# Patient Record
Sex: Male | Born: 1970 | Race: White | Hispanic: No | Marital: Single | Smoking: Former smoker
Health system: Southern US, Community
[De-identification: ages and names within clinical notes are randomized; demographics above are authoritative.]

## PROBLEM LIST (undated history)

## (undated) DIAGNOSIS — I519 Heart disease, unspecified: Secondary | ICD-10-CM

## (undated) DIAGNOSIS — Z720 Tobacco use: Secondary | ICD-10-CM

## (undated) DIAGNOSIS — Z8659 Personal history of other mental and behavioral disorders: Secondary | ICD-10-CM

## (undated) DIAGNOSIS — I4892 Unspecified atrial flutter: Secondary | ICD-10-CM

## (undated) DIAGNOSIS — I2510 Atherosclerotic heart disease of native coronary artery without angina pectoris: Secondary | ICD-10-CM

## (undated) DIAGNOSIS — N2889 Other specified disorders of kidney and ureter: Principal | ICD-10-CM

## (undated) DIAGNOSIS — I253 Aneurysm of heart: Secondary | ICD-10-CM

## (undated) DIAGNOSIS — I10 Essential (primary) hypertension: Secondary | ICD-10-CM

## (undated) DIAGNOSIS — I214 Non-ST elevation (NSTEMI) myocardial infarction: Secondary | ICD-10-CM

## (undated) DIAGNOSIS — B20 Human immunodeficiency virus [HIV] disease: Secondary | ICD-10-CM

## (undated) DIAGNOSIS — F319 Bipolar disorder, unspecified: Secondary | ICD-10-CM

## (undated) DIAGNOSIS — Z21 Asymptomatic human immunodeficiency virus [HIV] infection status: Secondary | ICD-10-CM

## (undated) HISTORY — PX: MULTIPLE TOOTH EXTRACTIONS: SHX2053

## (undated) HISTORY — DX: Other specified disorders of kidney and ureter: N28.89

## (undated) HISTORY — DX: Essential (primary) hypertension: I10

## (undated) HISTORY — DX: Personal history of other mental and behavioral disorders: Z86.59

## (undated) HISTORY — DX: Human immunodeficiency virus (HIV) disease: B20

## (undated) HISTORY — DX: Bipolar disorder, unspecified: F31.9

## (undated) HISTORY — DX: Unspecified atrial flutter: I48.92

## (undated) HISTORY — DX: Atherosclerotic heart disease of native coronary artery without angina pectoris: I25.10

## (undated) HISTORY — PX: CORONARY ARTERY BYPASS GRAFT: SHX141

## (undated) HISTORY — PX: HERNIA REPAIR: SHX51

## (undated) HISTORY — DX: Non-ST elevation (NSTEMI) myocardial infarction: I21.4

## (undated) HISTORY — DX: Heart disease, unspecified: I51.9

## (undated) HISTORY — DX: Tobacco use: Z72.0

## (undated) HISTORY — DX: Asymptomatic human immunodeficiency virus (hiv) infection status: Z21

## (undated) HISTORY — DX: Aneurysm of heart: I25.3

---

## 2000-02-26 DIAGNOSIS — Z21 Asymptomatic human immunodeficiency virus [HIV] infection status: Secondary | ICD-10-CM

## 2000-02-26 DIAGNOSIS — B20 Human immunodeficiency virus [HIV] disease: Secondary | ICD-10-CM

## 2000-02-26 HISTORY — DX: Human immunodeficiency virus (HIV) disease: B20

## 2000-02-26 HISTORY — DX: Asymptomatic human immunodeficiency virus (hiv) infection status: Z21

## 2004-10-22 ENCOUNTER — Emergency Department: Payer: Self-pay | Admitting: Emergency Medicine

## 2004-10-26 ENCOUNTER — Emergency Department: Payer: Self-pay | Admitting: Emergency Medicine

## 2004-10-27 ENCOUNTER — Emergency Department: Payer: Self-pay | Admitting: Emergency Medicine

## 2008-03-23 ENCOUNTER — Other Ambulatory Visit: Payer: Self-pay

## 2008-03-23 ENCOUNTER — Emergency Department: Payer: Self-pay | Admitting: Emergency Medicine

## 2008-05-03 ENCOUNTER — Emergency Department: Payer: Self-pay | Admitting: Emergency Medicine

## 2008-07-11 ENCOUNTER — Emergency Department: Payer: Self-pay | Admitting: Emergency Medicine

## 2009-04-20 ENCOUNTER — Emergency Department: Payer: Self-pay

## 2009-04-22 DIAGNOSIS — E785 Hyperlipidemia, unspecified: Secondary | ICD-10-CM

## 2009-04-25 ENCOUNTER — Emergency Department: Payer: Self-pay | Admitting: Emergency Medicine

## 2009-04-26 ENCOUNTER — Emergency Department: Payer: Self-pay | Admitting: Emergency Medicine

## 2009-05-31 DIAGNOSIS — L409 Psoriasis, unspecified: Secondary | ICD-10-CM | POA: Insufficient documentation

## 2009-09-11 DIAGNOSIS — I252 Old myocardial infarction: Secondary | ICD-10-CM | POA: Insufficient documentation

## 2009-09-14 ENCOUNTER — Inpatient Hospital Stay: Payer: Self-pay | Admitting: Cardiology

## 2009-09-14 ENCOUNTER — Ambulatory Visit: Payer: Self-pay | Admitting: Cardiology

## 2009-09-15 ENCOUNTER — Inpatient Hospital Stay (HOSPITAL_COMMUNITY): Admission: EM | Admit: 2009-09-15 | Discharge: 2009-09-25 | Payer: Self-pay | Admitting: Internal Medicine

## 2009-09-15 ENCOUNTER — Ambulatory Visit: Payer: Self-pay | Admitting: Cardiology

## 2009-09-16 ENCOUNTER — Encounter: Payer: Self-pay | Admitting: Cardiology

## 2009-09-16 ENCOUNTER — Ambulatory Visit: Payer: Self-pay | Admitting: Cardiothoracic Surgery

## 2009-09-18 DIAGNOSIS — I259 Chronic ischemic heart disease, unspecified: Secondary | ICD-10-CM | POA: Insufficient documentation

## 2009-09-27 ENCOUNTER — Emergency Department: Payer: Self-pay | Admitting: Emergency Medicine

## 2009-09-28 ENCOUNTER — Ambulatory Visit: Payer: Self-pay | Admitting: Cardiovascular Disease

## 2009-10-14 ENCOUNTER — Ambulatory Visit: Payer: Self-pay | Admitting: Cardiothoracic Surgery

## 2009-10-14 ENCOUNTER — Encounter: Admission: RE | Admit: 2009-10-14 | Discharge: 2009-10-14 | Payer: Self-pay | Admitting: Cardiothoracic Surgery

## 2009-10-20 ENCOUNTER — Ambulatory Visit: Payer: Self-pay | Admitting: Cardiovascular Disease

## 2009-10-20 DIAGNOSIS — I10 Essential (primary) hypertension: Secondary | ICD-10-CM

## 2009-10-20 DIAGNOSIS — I25719 Atherosclerosis of autologous vein coronary artery bypass graft(s) with unspecified angina pectoris: Secondary | ICD-10-CM

## 2009-10-28 ENCOUNTER — Encounter: Payer: Self-pay | Admitting: Cardiovascular Disease

## 2009-11-04 ENCOUNTER — Encounter: Payer: Self-pay | Admitting: Cardiovascular Disease

## 2009-11-09 ENCOUNTER — Encounter: Payer: Self-pay | Admitting: Cardiovascular Disease

## 2009-11-09 ENCOUNTER — Ambulatory Visit: Payer: Self-pay

## 2009-11-15 ENCOUNTER — Telehealth: Payer: Self-pay | Admitting: Cardiovascular Disease

## 2009-11-17 ENCOUNTER — Ambulatory Visit: Payer: Self-pay | Admitting: Cardiovascular Disease

## 2009-11-18 LAB — CONVERTED CEMR LAB
Alkaline Phosphatase: 75 units/L (ref 39–117)
CO2: 21 meq/L (ref 19–32)
Cholesterol: 92 mg/dL (ref 0–200)
Creatinine, Ser: 0.97 mg/dL (ref 0.40–1.50)
Glucose, Bld: 81 mg/dL (ref 70–99)
HDL: 31 mg/dL — ABNORMAL LOW (ref >39)
LDL Cholesterol: 44 mg/dL (ref 0–99)
Potassium: 4 meq/L (ref 3.5–5.3)
Total Bilirubin: 0.4 mg/dL (ref 0.3–1.2)
Total CHOL/HDL Ratio: 3
Total Protein: 8 g/dL (ref 6.0–8.3)
Triglycerides: 84 mg/dL (ref <150)
VLDL: 17 mg/dL (ref 0–40)

## 2009-11-19 ENCOUNTER — Encounter: Payer: Self-pay | Admitting: Internal Medicine

## 2009-11-26 ENCOUNTER — Encounter: Payer: Self-pay | Admitting: Cardiovascular Disease

## 2009-12-06 ENCOUNTER — Telehealth: Payer: Self-pay | Admitting: Cardiology

## 2009-12-24 ENCOUNTER — Telehealth: Payer: Self-pay | Admitting: Cardiology

## 2009-12-26 ENCOUNTER — Encounter: Payer: Self-pay | Admitting: Cardiovascular Disease

## 2009-12-29 ENCOUNTER — Encounter: Payer: Self-pay | Admitting: Cardiology

## 2010-01-05 ENCOUNTER — Telehealth: Payer: Self-pay | Admitting: Cardiovascular Disease

## 2010-01-19 ENCOUNTER — Encounter: Payer: Self-pay | Admitting: Cardiovascular Disease

## 2010-01-26 ENCOUNTER — Encounter: Payer: Self-pay | Admitting: Cardiovascular Disease

## 2010-01-31 ENCOUNTER — Encounter: Payer: Self-pay | Admitting: Internal Medicine

## 2010-02-04 ENCOUNTER — Telehealth: Payer: Self-pay | Admitting: Internal Medicine

## 2010-02-04 ENCOUNTER — Encounter: Payer: Self-pay | Admitting: Cardiovascular Disease

## 2010-04-15 ENCOUNTER — Encounter: Payer: Self-pay | Admitting: Cardiovascular Disease

## 2010-04-19 ENCOUNTER — Ambulatory Visit: Payer: Self-pay | Admitting: Cardiovascular Disease

## 2010-04-19 ENCOUNTER — Ambulatory Visit: Payer: Self-pay

## 2010-04-19 DIAGNOSIS — B20 Human immunodeficiency virus [HIV] disease: Secondary | ICD-10-CM

## 2010-04-19 DIAGNOSIS — I739 Peripheral vascular disease, unspecified: Secondary | ICD-10-CM | POA: Insufficient documentation

## 2010-05-07 ENCOUNTER — Encounter: Payer: Self-pay | Admitting: Cardiovascular Disease

## 2010-05-23 ENCOUNTER — Telehealth: Payer: Self-pay | Admitting: Cardiovascular Disease

## 2010-06-18 ENCOUNTER — Encounter: Payer: Self-pay | Admitting: Cardiovascular Disease

## 2010-06-18 ENCOUNTER — Ambulatory Visit: Payer: Self-pay | Admitting: Cardiovascular Disease

## 2010-06-18 ENCOUNTER — Observation Stay: Payer: Self-pay | Admitting: Internal Medicine

## 2010-06-20 ENCOUNTER — Telehealth: Payer: Self-pay | Admitting: Cardiovascular Disease

## 2010-06-27 ENCOUNTER — Ambulatory Visit: Payer: Self-pay | Admitting: Cardiovascular Disease

## 2010-06-27 ENCOUNTER — Observation Stay: Payer: Self-pay | Admitting: Internal Medicine

## 2010-06-28 ENCOUNTER — Encounter: Payer: Self-pay | Admitting: Cardiovascular Disease

## 2010-08-01 ENCOUNTER — Telehealth: Payer: Self-pay | Admitting: Cardiovascular Disease

## 2010-08-11 ENCOUNTER — Encounter: Payer: Self-pay | Admitting: Cardiovascular Disease

## 2010-08-11 ENCOUNTER — Ambulatory Visit: Payer: Self-pay | Admitting: Cardiovascular Disease

## 2010-08-11 DIAGNOSIS — E785 Hyperlipidemia, unspecified: Secondary | ICD-10-CM

## 2010-09-27 NOTE — Progress Notes (Signed)
Summary: Monitor  Phone Note Call from Patient Call back at [phone]   Caller: Self Call For: [facility] Summary of Call: Pt just went off of monitor and would like to know if he needs to come in for an appt. Initial call taken by: Harlon Flor,  August 01, 2010 1:21 PM  Follow-up for Phone Call        Spoke to pt, follow up scheduled with Dr. Mariah Milling next week 08/11/10 Follow-up by: Lanny Hurst RN,  August 01, 2010 3:11 PM

## 2010-09-27 NOTE — Assessment & Plan Note (Signed)
Summary: NURSE/AMD  Nurse Visit   Vital Signs:  Patient profile:   40 year old male Weight:      216.50 pounds Pulse rate:   80 / minute Pulse rhythm:   regular BP sitting:   102 / 64  (left arm) Cuff size:   regular  Vitals Entered By: Charlena Cross, RN, BSN (September 28, 2009 3:17 PM)  Patient Instructions: 1)  Your physician recommends that you schedule a follow-up appointment in: 2/23 @ 10:30

## 2010-09-27 NOTE — Progress Notes (Signed)
Summary: clearance for dental extractions  Phone Note Outgoing Call   Call placed by: Cloyde Reams RN,  Jan 05, 2010 3:08 PM Call placed to: Patient Summary of Call: Called pt to give information on Dr Alford Highland recommendations in previous appended phone note, and pt wanted to know if it has been long enough since his OHS that he can proceed with some dental work needed, and tooth extractions.   Initial call taken by: Cloyde Reams RN,  Jan 05, 2010 3:12 PM  Follow-up for Phone Call        Should be fine to proceed with dental procedure     Appended Document: clearance for dental extractions Called spoke with pt advised OK per Dr Mariah Milling to proceed with dental work.  EWJ

## 2010-09-27 NOTE — Assessment & Plan Note (Signed)
Summary: F6M/AMD   Visit Type:  Follow-up Referring Provider:  Shirlee Latch Primary Provider:  N/A  CC:  c/o chest pain for 6 months..  History of Present Illness: Jared Williams a 40 year old gentleman with coronary artery disease, non-ST elevation MI in January 2011, transferred from [facility] to [facility] for two-vessel bypass surgery. He had a LIMA to the LAD, vein graft to the PDA. Also with apical aneurysm resection. History of postop atrial flutter, on amiodarone. History of HIV, bipolar disorder, schizophrenia, long history of tobacco abuse stopped in January 2011.  He presents for routine followup post CABG. He reports that he has been tired. He does not do any exercise during the daytime. He wonders if his fatigue is due to the study medicine for his HIV that he is taking. He is not eating well as he has low income and has not so healthy foods. He denies any tachycardia palpitations, does have some atypical type chest discomfort which he attributes to postsurgical healing  EKG shows normal sinus rhythm with rate of 56 beats per minute, T-wave abnormality in V3, V4  Current Medications (verified): 1)  Aspirin Ec 325 Mg Tbec (Aspirin) .... Take One Tablet By Mouth Daily 2)  Carvedilol 25 Mg Tabs (Carvedilol) .... Take One Tablet By Mouth Twice A Day 3)  Lisinopril 2.5 Mg Tabs (Lisinopril) .... Take One Tablet By Mouth Daily 4)  Crestor 40 Mg Tabs (Rosuvastatin Calcium) .... Take One Tablet By Mouth Daily. 5)  Spironolactone 25 Mg Tabs (Spironolactone) .... 1/2  Tablet By Mouth Daily 6)  Klonopin 1 Mg Tabs (Clonazepam) .Marland Kitchen.. 1 By Mouth Three Times A Day As Needed For Anxiety 7)  Study Drug For Hiv  Allergies (verified): 1)  ! Pcn 2)  ! Ampicillin  Past History:  Past Medical History: Last updated: 10/20/2009  1. Acute non-ST segment elevation myocardial infarction.   2. Left ventricular dysfunction.   3. Coronary artery disease.   4. Left ventricular apical  aneurysm.   5. Postoperative atrial flutter.   6. History of human immunodeficiency virus, currently under treatment       by Dr. Zenaida Niece Der Hildred Laser at [facility] on no antiretroviral therapy.   7. Hypertension.   8. Bipolar disorder.   9. History of schizophrenia.   10.Long history of tobacco abuse.   11.Staged recent dental extractions.   Past Surgical History: Last updated: 10/20/2009 CABG  Dental extractions partially completed Hernia repair on the left.   Family History: Last updated: 10/20/2009 Unknown.  The patient was adopted  Social History: Last updated: 10/20/2009 The patient lives alone, has 1 daughter.  Smoked 1 pack   a day for at least 23 years.  Denies alcohol use.  Current disability   for psychiatric illness.  Lives in [place].   Risk Factors: Alcohol Use: 0 (10/20/2009) Caffeine Use: no (10/20/2009) Exercise: yes (10/20/2009)  Risk Factors: Smoking Status: quit (10/20/2009)  Review of Systems  The patient denies fever, weight loss, weight gain, vision loss, decreased hearing, hoarseness, chest pain, syncope, dyspnea on exertion, peripheral edema, prolonged cough, abdominal pain, incontinence, muscle weakness, depression, and enlarged lymph nodes.         Fatigue, atypical chest pains  Vital Signs:  Patient profile:   40 year old male Height:      72 inches Weight:      220 pounds BMI:     29.95 Pulse rate:   59 / minute BP sitting:   118 / 80  (  left arm) Cuff size:   regular  Vitals Entered By: Bishop Dublin, CMA (April 19, 2010 9:51 AM)  Physical Exam  General:  well-appearing gentleman in no apparent distress, alert and oriented x3 , HEENT exam is benign apart from terrible dentition, oropharynx is clear, neck is supple with no JVP or carotid bruits, heart sounds are regular with normal S1-S2 and no murmurs appreciated, lungs are clear to auscultation with no wheezes or rales, abdominal exam is benign, no significant lower extremity edema, pulses are  equal and symmetrical in his upper extremities, decreased in LE, neurologic exam is grossly nonfocal, skin is warm and dry.mediastinal incision site is well-healed.   Impression & Recommendations:  Problem # 1:  CORONARY ATHEROSCLEROSIS OF ARTERY BYPASS GRAFT (ICD-414.04) we'll continue aggressive medical management. Chest pain is atypical in nature and likely musculoskeletal. I've asked him to monitor his heart rate at home. He is 56 in the office today. If he continues to have significant bradycardia with rates in the low 50s, we could consider decreasing his Coreg to 12.5 b.i.d.  His updated medication list for this problem includes:    Aspirin Ec 325 Mg Tbec (Aspirin) .Marland Kitchen... Take one tablet by mouth daily    Carvedilol 25 Mg Tabs (Carvedilol) .Marland Kitchen... Take one tablet by mouth twice a day    Lisinopril 2.5 Mg Tabs (Lisinopril) .Marland Kitchen... Take one tablet by mouth daily  Problem # 2:  INTERMITTENT CLAUDICATION, BILATERAL (ICD-443.9) He has decreased pulses in his lower extremities. We did an ABI exam today and this was essentially normal. No further workup indicated.  Problem # 3:  ESSENTIAL HYPERTENSION (ICD-401.9) blood pressure is adequate and we had not changed his medication regimen.  His updated medication list for this problem includes:    Aspirin Ec 325 Mg Tbec (Aspirin) .Marland Kitchen... Take one tablet by mouth daily    Carvedilol 25 Mg Tabs (Carvedilol) .Marland Kitchen... Take one tablet by mouth twice a day    Lisinopril 2.5 Mg Tabs (Lisinopril) .Marland Kitchen... Take one tablet by mouth daily    Spironolactone 25 Mg Tabs (Spironolactone) .Marland Kitchen... 1/2  tablet by mouth daily  Problem # 4:  HIV INFECTION (ICD-042) He has had infection for 10 years. Currently on a study medicine for his HIV.  Patient Instructions: 1)  Your physician recommends that you continue on your current medications as directed. Please refer to the Current Medication list given to you today. 2)  Your physician wants you to follow-up in:   6 months  You will receive a reminder letter in the mail two months in advance. If you don't receive a letter, please call our office to schedule the follow-up appointment. 3)  Your physician has requested that you have a lower extremity arterial duplex.  This test is an ultrasound of the arteries in the legs.  It looks at arterial blood flow in the legs.  Allow one hour for Lower.  4)  There are no restrictions or special instructions. 5)  Your physician has requested that you regularly monitor and record your blood pressure readings at home.

## 2010-09-27 NOTE — Miscellaneous (Signed)
Summary: Heart Track Discharge  Heart Track Discharge   Imported By: Harlon Flor 02/16/2010 08:36:07  _____________________________________________________________________  External Attachment:    Type:   Image     Comment:   External Document

## 2010-09-27 NOTE — Progress Notes (Signed)
Summary: Question  Phone Note Call from Patient Call back at Home Phone [phone]   Caller: Patient Call For: RN Summary of Call: Patient called wanting to speak with RN, said he had a personal medical question.  Would like a call back. Initial call taken by: [place],  December 06, 2009 8:29 AM  Follow-up for Phone Call        Left message to call. 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 states that now that he has had CABG, his ID MD would like to enroll him in a study investigating pts with heart disease who are HIV +.  Pt would like MD's opinion on antiretroviral medications and their effects on the heart before starting study. Additional Follow-up by: Charlena Cross, RN, BSN,  December 06, 2009 11:36 AM     Appended Document: Question some antiretrovirals cause can adversely affect lipids which can increase heart disease risk.  However, that can be controlled with statins. Would be interested to know exactly what is being studied in this trial.   Appended Document: Question pt aware and will obtain prospectus for study intent. Charlena Cross RN BSN   Appended Document: Question pt called with more information regarding potential study that his PCP is encouraging he participate in.  Study participants will be assigned to either group rec'ing ATRIPLA (current HIV medication) or a new experimental medication being termed BTRIPLA (Btripla is intended to be a safer follow-up to Atripla, a single-pill combination of Truvada plus [employer]s Sustiva (efavirenz) ).  Pts PCP feels that his recent MI could have been the result of untreated HIV.  Pt would like Dr. Kathlyn Sacramento opinion. Charlena Cross RN BSN   Appended Document: Question HIV promotes accelerated atherosclerosis.  Inflammatory state can increase risk of plaque  rupture.   Appended Document: Question Spoke with pt advised of Dr Alford Highland response.   EWJ

## 2010-09-27 NOTE — Letter (Signed)
Summary: [facility]   Imported By: Harlon Flor 05/16/2010 14:59:57  _____________________________________________________________________  External Attachment:    Type:   Image     Comment:   External Document

## 2010-09-27 NOTE — Consult Note (Signed)
SummaryScientist, physiological [facility]   [facility]   Imported By: Roderic Ovens 06/24/2010 16:10:51  _____________________________________________________________________  External Attachment:    Type:   Image     Comment:   External Document

## 2010-09-27 NOTE — Progress Notes (Signed)
Summary: HEART TRACK  Phone Note Call from Patient Call back at Home Phone [phone]   Caller: SELF Call For: [place] Summary of Call: PT WANTED TO LET THE PHYSICIAN KNOW THAT HE HAS COMPLETED HEART TRACK AND DID NOT KNOW IF HE NEEDED TO SCHEDULE A F/U APPT BECAUSE OF THAT Initial call taken by: Harlon Flor,  February 04, 2010 1:57 PM  Follow-up for Phone Call        Called spoke with pt advised as long as he is not having problems last OV note from 2/11 states pt needs f/u ov in 6 months. Pt states he is doing well and WCB for sooner appt if needed. Follow-up by: Cloyde Reams RN,  February 04, 2010 3:31 PM

## 2010-09-27 NOTE — Progress Notes (Signed)
Summary: RX  Phone Note Refill Request Call back at Home Phone [phone] Message from:  Patient on June 20, 2010 2:39 PM  Refills Requested: Medication #1:  LISINOPRIL 2.5 MG TABS Take one tablet by mouth daily  Medication #2:  CARVEDILOL 25 MG TABS Take one tablet by mouth twice a day  Medication #3:  SPIRONOLACTONE 25 MG TABS 1/2  tablet by mouth daily  Medication #4:  CRESTOR 40 MG TABS Take one tablet by mouth daily. [facility] [address]  Initial call taken by: Harlon Flor,  June 20, 2010 2:39 PM    Prescriptions: SPIRONOLACTONE 25 MG TABS (SPIRONOLACTONE) 1/2  tablet by mouth daily  #30 x 3   Entered by:   Bishop Dublin, CMA   Authorized by:   Dossie Arbour MD   Signed by:   Bishop Dublin, CMA on 06/20/2010   Method used:   Electronically to        [facility] [address] (retail)       [address]       [place], [place]  [address]       Ph: [phone]       Fax: [phone]   RxID:   [id number] CRESTOR 40 MG TABS (ROSUVASTATIN CALCIUM) Take one tablet by mouth daily.  #30 x 6   Entered by:   Bishop Dublin, CMA   Authorized by:   Dossie Arbour MD   Signed by:   Bishop Dublin, CMA on 06/20/2010   Method used:   Electronically to        [facility] [address] (retail)       [address]       [place], [place]  [address]       Ph: [phone]       Fax: [phone]   RxID:   [id number] LISINOPRIL 2.5 MG TABS (LISINOPRIL) Take one tablet by mouth daily  #30 x 6   Entered by:   Bishop Dublin, CMA   Authorized by:   Dossie Arbour MD   Signed by:   Bishop Dublin, CMA on 06/20/2010   Method used:   Electronically to        [facility] [address] (retail)       [address]       [place], [place]  [address]       Ph: [phone]       Fax: [phone]   RxID:   [id number] CARVEDILOL 25 MG TABS (CARVEDILOL)  Take one tablet by mouth twice a day  #60 x 6   Entered by:   Bishop Dublin, CMA   Authorized by:   Dossie Arbour MD   Signed by:   Bishop Dublin, CMA on 06/20/2010   Method used:   Electronically to        [facility] [address] (retail)       [address]       [place], [place]  [address]       Ph: [phone]       Fax: [phone]   RxID:   [id number]

## 2010-09-27 NOTE — Miscellaneous (Signed)
Summary: Heart Track  Heart Track   Imported By: Harlon Flor 10/29/2009 14:36:29  _____________________________________________________________________  External Attachment:    Type:   Image     Comment:   External Document

## 2010-09-27 NOTE — Progress Notes (Signed)
Summary: RX  Phone Note Refill Request Call back at Home Phone [phone] Message from:  Patient on November 15, 2009 12:08 PM  Refills Requested: Medication #1:  CARVEDILOL 25 MG TABS Take one tablet by mouth twice a day  Medication #2:  LISINOPRIL 2.5 MG TABS Take one tablet by mouth daily  Medication #3:  CRESTOR 40 MG TABS Take one tablet by mouth daily.  Medication #4:  SPIRONOLACTONE 25 MG TABS 1/2  tablet by mouth daily NEEDS [facility] [address]  Initial call taken by: Harlon Flor,  November 15, 2009 12:10 PM    Prescriptions: SPIRONOLACTONE 25 MG TABS (SPIRONOLACTONE) 1/2  tablet by mouth daily  #30 x 3   Entered by:   Mercer Pod   Authorized by:   Dossie Arbour MD   Signed by:   Mercer Pod on 11/15/2009   Method used:   Electronically to        [facility] [address] (retail)       [address]       [place], [place]  [address]       Ph: [phone]       Fax: [phone]   RxID:   [id number] CRESTOR 40 MG TABS (ROSUVASTATIN CALCIUM) Take one tablet by mouth daily.  #30 x 6   Entered by:   Mercer Pod   Authorized by:   Dossie Arbour MD   Signed by:   Mercer Pod on 11/15/2009   Method used:   Electronically to        [facility] [address] (retail)       [address]       [place], [place]  [address]       Ph: [phone]       Fax: [phone]   RxID:   [id number] LISINOPRIL 2.5 MG TABS (LISINOPRIL) Take one tablet by mouth daily  #30 x 6   Entered by:   Mercer Pod   Authorized by:   Dossie Arbour MD   Signed by:   Mercer Pod on 11/15/2009   Method used:   Electronically to        [facility] [address] (retail)       [address]       [place], [place]  [address]       Ph: [phone]       Fax: [phone]   RxID:   [id number] CARVEDILOL 25 MG TABS  (CARVEDILOL) Take one tablet by mouth twice a day  #60 x 6   Entered by:   Mercer Pod   Authorized by:   Dossie Arbour MD   Signed by:   Mercer Pod on 11/15/2009   Method used:   Electronically to        [facility] [address] (retail)       [address]       [place], [place]  [address]       Ph: [phone]       Fax: [phone]   RxID:   [id number]

## 2010-09-27 NOTE — Miscellaneous (Signed)
Summary: [facility] Written Confirmation of Verbal Order  [facility] Written Confirmation of Verbal Order   Imported By: Roderic Ovens 01/25/2010 15:19:24  _____________________________________________________________________  External Attachment:    Type:   Image     Comment:   External Document

## 2010-09-27 NOTE — Progress Notes (Signed)
Summary: EXTRACTION  Phone Note Call from Patient Call back at [phone]   Caller: SELF Call For: [facility] Summary of Call: PT HAD TEETH EXTRACTED TODAY AND WAS TOLD TO STOP ASPIRIN FOR SEVERAL DAYS-WOULD LIKE TO KNOW IF THIS IS OK. Initial call taken by: Harlon Flor,  May 23, 2010 2:52 PM  Follow-up for Phone Call        [facility]CB Benedict Needy, RN  May 24, 2010 2:08 PM   Additional Follow-up for Phone Call Additional follow up Details #1::        notified patient of tooth extraction, patient states had done yesterday 05/23/2010.  He is calling after the fact to tell us he stopped aspirin per the dentist.  He will be off for one to two more days and then start back.   Additional Follow-up by: Bishop Dublin, CMA,  May 24, 2010 2:43 PM

## 2010-09-27 NOTE — Letter (Signed)
SummaryScientist, physiological [facility]   [facility]   Imported By: Roderic Ovens 07/05/2010 15:54:45  _____________________________________________________________________  External Attachment:    Type:   Image     Comment:   External Document

## 2010-09-27 NOTE — Miscellaneous (Signed)
Summary: [facility]  [facility]   Imported By: Harlon Flor 04/19/2010 15:02:10  _____________________________________________________________________  External Attachment:    Type:   Image     Comment:   External Document

## 2010-09-27 NOTE — Miscellaneous (Signed)
Summary: [facility]  [facility]   Imported By: Harlon Flor 01/21/2010 11:29:38  _____________________________________________________________________  External Attachment:    Type:   Image     Comment:   External Document

## 2010-09-27 NOTE — Assessment & Plan Note (Signed)
Summary: f3w   Visit Type:  EC6 Referring Provider:  Shirlee Latch Primary Provider:  N/A  CC:  just incision aches and pains, don't take much pain meds, and due to constipation. no sob. no edema..  History of Present Illness: Jared Williams a 39 year old gentleman with coronary artery disease, non-ST elevation MI in January 2011, transferred from [facility] to [facility] for two-vessel bypass surgery. He had a LIMA to the LAD, vein graft to the PDA. Also with apical aneurysm resection. History of postop atrial flutter, on amiodarone. History of HIV, bipolar disorder, schizophrenia, long history of tobacco abuse stopped in January 2011.  He presents for routine followup post CABG. He states that he feels well, is emanating for 15 minutes a day though continues to have some shortness of breath. He is interested in cardiac rehabilitation. He states that his chest is healing well. His amiodarone was decreased to 200 mg daily last week and he has not noted any tachypalpitations.  He is not noticed any edema. He does have occasional lightheadedness and he has to stand for a minute before this resolves. No recent lipid panel since he had been on Crestor for the last month.  Preventive Screening-Counseling & Management  Alcohol-Tobacco     Alcohol drinks/day: 0     Smoking Status: quit  Caffeine-Diet-Exercise     Caffeine use/day: no     Does Patient Exercise: yes     Type of exercise: walk      Exercise (avg: min/session): 15 mins     Times/week: 7 days a week  Current Problems (verified): 1)  Essential Hypertension  (ICD-401.9) 2)  Coronary Atherosclerosis of Artery Bypass Graft  (ICD-414.04)  Current Medications (verified): 1)  Amiodarone Hcl 200 Mg Tabs (Amiodarone Hcl) .... Take One Tablet By Mouth Daily 2)  Aspirin Ec 325 Mg Tbec (Aspirin) .... Take One Tablet By Mouth Daily 3)  Carvedilol 25 Mg Tabs (Carvedilol) .... Take One Tablet By Mouth Twice A Day 4)   Lisinopril 2.5 Mg Tabs (Lisinopril) .... Take One Tablet By Mouth Daily 5)  Nicoderm Cq 14 Mg/24hr Pt24 (Nicotine) .... As Directed 6)  Lortab 7.5 7.5-500 Mg Tabs (Hydrocodone-Acetaminophen) .Marland Kitchen.. 1 By Mouth Once Daily As Needed Every 6 Hours 7)  Crestor 40 Mg Tabs (Rosuvastatin Calcium) .... Take One Tablet By Mouth Daily. 8)  Aggrenox 25-200 Mg Xr12h-Cap (Aspirin-Dipyridamole) .... Take One Capsule By Mouth Twice A Day 9)  Spironolactone 25 Mg Tabs (Spironolactone) .... 1/2  Tablet By Mouth Daily 10)  Klonopin 1 Mg Tabs (Clonazepam) .Marland Kitchen.. 1 By Mouth Three Times A Day As Needed For Anxiety  Allergies (verified): 1)  ! Pcn 2)  ! Ampicillin  Past History:  Past Medical History:  1. Acute non-ST segment elevation myocardial infarction.   2. Left ventricular dysfunction.   3. Coronary artery disease.   4. Left ventricular apical aneurysm.   5. Postoperative atrial flutter.   6. History of human immunodeficiency virus, currently under treatment       by Dr. Zenaida Niece Der Hildred Laser at [facility] on no antiretroviral therapy.   7. Hypertension.   8. Bipolar disorder.   9. History of schizophrenia.   10.Long history of tobacco abuse.   11.Staged recent dental extractions.   Past Surgical History: CABG  Dental extractions partially completed Hernia repair on the left.   Family History: Unknown.  The patient was adopted  Social History: The patient lives alone, has 1 daughter.  Smoked 1 pack  a day for at least 23 years.  Denies alcohol use.  Current disability   for psychiatric illness.  Lives in [place]. Alcohol drinks/day:  0 Smoking Status:  quit Caffeine use/day:  no Does Patient Exercise:  yes  Review of Systems       The patient complains of dyspnea on exertion.  The patient denies anorexia, fever, weight loss, weight gain, vision loss, decreased hearing, hoarseness, chest pain, syncope, peripheral edema, prolonged cough, headaches, hemoptysis, abdominal pain, melena, hematochezia,  severe indigestion/heartburn, hematuria, incontinence, genital sores, muscle weakness, suspicious skin lesions, transient blindness, difficulty walking, depression, unusual weight change, abnormal bleeding, enlarged lymph nodes, angioedema, breast masses, and testicular masses.    Physical Exam  General:  well-appearinggentleman in no apparent distress, alert and oriented x3 , HEENT exam is benign apart from terrible dentition, oropharynx is clear, neck is supple with no JVP or carotid bruits, heart sounds are regular with normal S1-S2 and no murmurs appreciated, lungs are clear to auscultation with no wheezes or rales, abdominal exam is benign, no significant lower extremity edema, pulses are equal and symmetrical in his upper and lower extremities, neurologic exam is grossly nonfocal, skin is warm and dry.mediastinal incision site is well-healed.   EKG  Procedure date:  10/20/2009  Findings:      normal sinus rhythm with a rate of 64 beats per minute, possible anterior infarct with poor R-wave progression through the anterior precordial leads. T Wave inversions noted in V1 through V6.  Impression & Recommendations:  Problem # 1:  CORONARY ATHEROSCLEROSIS OF ARTERY BYPASS GRAFT (ICD-414.04) [name] appears to be doing well following his bypass surgery last month. We will hold his amiodarone given that  he is maintaining normal sinus rhythm. have asked him to contact me if he has any tachycardia palpitations. We will check his cholesterol in 6 weeks' time as well as repeat an echocardiogram to get a baseline LV function post bypass.  His updated medication list for this problem includes:    Aspirin Ec 325 Mg Tbec (Aspirin) .Marland Kitchen... Take one tablet by mouth daily    Carvedilol 25 Mg Tabs (Carvedilol) .Marland Kitchen... Take one tablet by mouth twice a day    Lisinopril 2.5 Mg Tabs (Lisinopril) .Marland Kitchen... Take one tablet by mouth daily    Aggrenox 25-200 Mg Xr12h-cap (Aspirin-dipyridamole) .Marland Kitchen... Take one capsule  by mouth twice a day  Orders: Echocardiogram (Echo)  Problem # 2:  ESSENTIAL HYPERTENSION (ICD-401.9)  borderline hypotension on his current medication regimen. I've asked him to contact us if his dizziness gets worse in which case we would likely decrease the dose of his Coreg to 12.5 mg b.i.d. We've made no changes at this time. His updated medication list for this problem includes:    Aspirin Ec 325 Mg Tbec (Aspirin) .Marland Kitchen... Take one tablet by mouth daily    Carvedilol 25 Mg Tabs (Carvedilol) .Marland Kitchen... Take one tablet by mouth twice a day    Lisinopril 2.5 Mg Tabs (Lisinopril) .Marland Kitchen... Take one tablet by mouth daily    Spironolactone 25 Mg Tabs (Spironolactone) .Marland Kitchen... 1/2  tablet by mouth daily  Patient Instructions: 1)  Your physician recommends that you schedule a follow-up appointment in: 6 months 2)  Your physician recommends that you return for a FASTING lipid profile: 6 weeks (lipids) 3)  Your physician has requested that you have an echocardiogram.  Echocardiography is a painless test that uses sound waves to create images of your heart. It provides your doctor with information about  the size and shape of your heart and how well your heart's chambers and valves are working.  This procedure takes approximately one hour. There are no restrictions for this procedure. 4)  Your physician recommends referral and attendance at a Cardiac Rehab Program. [facility] will call with appt.  5)  Your physician has recommended you make the following change in your medication: stop amiodarone  Appended Document: f3w    Clinical Lists Changes  Medications: Removed medication of AGGRENOX 25-200 MG XR12H-CAP (ASPIRIN-DIPYRIDAMOLE) Take one capsule by mouth twice a day

## 2010-09-27 NOTE — Miscellaneous (Signed)
Summary: Orders Update  Clinical Lists Changes  Problems: Added new problem of INTERMITTENT CLAUDICATION, BILATERAL (ICD-443.9) Orders: Added new Test order of Arterial Duplex Lower Extremity (Arterial Duplex Low) - Signed 

## 2010-09-27 NOTE — Miscellaneous (Signed)
Summary: [facility]  [facility]   Imported By: Harlon Flor 01/10/2010 08:09:17  _____________________________________________________________________  External Attachment:    Type:   Image     Comment:   External Document

## 2010-09-27 NOTE — Progress Notes (Signed)
Summary: CALL BACK  Phone Note Call from Patient Call back at Home Phone [phone]   Caller: SELF Call For: [facility] Summary of Call: WOULD LIKE A CALL FROM MELISSA Initial call taken by: Harlon Flor,  December 24, 2009 10:13 AM  Follow-up for Phone Call        calling with study medication information. Follow-up by: Charlena Cross, RN, BSN,  December 24, 2009 10:44 AM

## 2010-09-27 NOTE — Miscellaneous (Signed)
Summary: Home Care Report  Home Care Report   Imported By: [place] Carbo 01/31/2010 09:10:12  _____________________________________________________________________  External Attachment:    Type:   Image     Comment:   External Document

## 2010-09-29 NOTE — Assessment & Plan Note (Signed)
Summary: F/U event monitor post hospital/MES   Visit Type:  Follow-up Referring Provider:  Shirlee Latch Primary Provider:  None  CC:  f/u [facility]. c/o chest pains and shoulder pain yesterday. Occasional SOB and nausea.  .  History of Present Illness: Mr. Jared Williams a 40 year old gentleman with coronary artery disease, non-ST elevation MI in January 2011, transferred from [facility] to [facility] for two-vessel bypass surgery. He had a LIMA to the LAD, vein graft to the PDA. Also with apical aneurysm resection. History of postop atrial flutter, on amiodarone. History of HIV, bipolar disorder, schizophrenia, long history of tobacco abuse stopped in January 2011 (total of 27yrs of smoking).  He was admitted in late October with diarrhea, chest pain. His symptoms were felt to be noncardiac.   He also had an episode of syncope. He was found to be hypotensive. Carotid ultrasound showed no stenosis. No arrhythmia on telemetry. His Lisinopril and Aldactone were held and his coreg was cut in half to 12.5 mg b.i.d..   His event monitor did not show any significant arrhythmia. There was too short periods of sinus tachycardia.  since the medication changes, he has felt well. He was having dizziness with standing and now he has no symptoms. No further near syncope or syncope. He has good energy with no chest pain.  EKG shows normal sinus rhythm with rate of 56 beats per minute, T-wave abnormality in V3, V4  Current Medications (verified): 1)  Aspir-Low 81 Mg Tbec (Aspirin) .Marland Kitchen.. 1 Tablet Daily 2)  Carvedilol 25 Mg Tabs (Carvedilol) .... Take One-Half  Tablet By Mouth Twice A Day 3)  Crestor 40 Mg Tabs (Rosuvastatin Calcium) .... Take One Tablet By Mouth Daily. 4)  Study Drug For Hiv 5)  B Complex Formula 1  Tabs (Vitamins-Lipotropics) .Marland Kitchen.. 1 Tablet Daily 6)  Vitamin C 500 Mg Tabs (Ascorbic Acid) .Marland Kitchen.. 1 Tablet Daily 7)  Fish Oil 1000 Mg Caps (Omega-3 Fatty Acids) .Marland Kitchen.. 1 Tablet  Daily  Allergies (verified): 1)  ! Pcn 2)  ! Ampicillin  Past History:  Past Medical History: Last updated: 10/20/2009  1. Acute non-ST segment elevation myocardial infarction.   2. Left ventricular dysfunction.   3. Coronary artery disease.   4. Left ventricular apical aneurysm.   5. Postoperative atrial flutter.   6. History of human immunodeficiency virus, currently under treatment       by Dr. Zenaida Niece Der Hildred Laser at [facility] on no antiretroviral therapy.   7. Hypertension.   8. Bipolar disorder.   9. History of schizophrenia.   10.Long history of tobacco abuse.   11.Staged recent dental extractions.   Past Surgical History: Last updated: 10/20/2009 CABG  Dental extractions partially completed Hernia repair on the left.   Family History: Last updated: 10/20/2009 Unknown.  The patient was adopted  Social History: Last updated: 10/20/2009 The patient lives alone, has 1 daughter.  Smoked 1 pack   a day for at least 23 years.  Denies alcohol use.  Current disability   for psychiatric illness.  Lives in [place].   Risk Factors: Alcohol Use: 0 (10/20/2009) Caffeine Use: no (10/20/2009) Exercise: yes (10/20/2009)  Risk Factors: Smoking Status: quit (10/20/2009)  Review of Systems  The patient denies fever, weight loss, weight gain, vision loss, decreased hearing, hoarseness, chest pain, syncope, dyspnea on exertion, peripheral edema, prolonged cough, abdominal pain, incontinence, muscle weakness, depression, and enlarged lymph nodes.    Vital Signs:  Patient profile:   40 year old male Height:  72 inches Weight:      234.50 pounds BMI:     31.92 Pulse rate:   56 / minute BP sitting:   100 / 62  (left arm) Cuff size:   regular  Vitals Entered By: Lysbeth Galas CMA (August 11, 2010 11:15 AM)  Physical Exam  General:  well-appearing gentleman in no apparent distress, alert and oriented x3 , HEENT exam is benign apart from terrible dentition, oropharynx is clear,  neck is supple with no JVP or carotid bruits, heart sounds are regular with normal S1-S2 and no murmurs appreciated, lungs are clear to auscultation with no wheezes or rales, abdominal exam is benign, no significant lower extremity edema, pulses are equal and symmetrical in his upper extremities, decreased in LE, neurologic exam is grossly nonfocal, skin is warm and dry.mediastinal incision site is well-healed. Psych:  Normal affect.   Impression & Recommendations:  Problem # 1:  CORONARY ATHEROSCLEROSIS OF ARTERY BYPASS GRAFT (ICD-414.04) no symptoms of angina. Continue aggressive medical management.  The following medications were removed from the medication list:    Lisinopril 2.5 Mg Tabs (Lisinopril) .Marland Kitchen... Take one tablet by mouth daily His updated medication list for this problem includes:    Aspir-low 81 Mg Tbec (Aspirin) .Marland Kitchen... 1 tablet daily    Carvedilol 25 Mg Tabs (Carvedilol) .Marland Kitchen... Take one-half  tablet by mouth twice a day  The following medications were removed from the medication list:    Lisinopril 2.5 Mg Tabs (Lisinopril) .Marland Kitchen... Take one tablet by mouth daily His updated medication list for this problem includes:    Aspir-low 81 Mg Tbec (Aspirin) .Marland Kitchen... 1 tablet daily    Carvedilol 25 Mg Tabs (Carvedilol) .Marland Kitchen... Take one-half  tablet by mouth twice a day  Problem # 2:  ESSENTIAL HYPERTENSION (ICD-401.9) Blood pressure has improved by holding lisinopril and Aldactone. I last him to monitor his blood pressure. I am hesitant to restart his ACE inhibitor given his blood pressure continues to be borderline low.  The following medications were removed from the medication list:    Lisinopril 2.5 Mg Tabs (Lisinopril) .Marland Kitchen... Take one tablet by mouth daily    Spironolactone 25 Mg Tabs (Spironolactone) .Marland Kitchen... 1/2  tablet by mouth daily His updated medication list for this problem includes:    Aspir-low 81 Mg Tbec (Aspirin) .Marland Kitchen... 1 tablet daily    Carvedilol 25 Mg Tabs (Carvedilol) .Marland Kitchen...  Take one-half  tablet by mouth twice a day  Problem # 3:  HYPERLIPIDEMIA-MIXED (ICD-272.4) Cholesterol is at goal on his current medication regimen.  His updated medication list for this problem includes:    Crestor 40 Mg Tabs (Rosuvastatin calcium) .Marland Kitchen... Take one tablet by mouth daily.  Patient Instructions: 1)  Your physician recommends that you schedule a follow-up appointment in: 6 months 2)  Your physician recommends that you continue on your current medications as directed. Please refer to the Current Medication list given to you today.

## 2010-09-29 NOTE — Consult Note (Signed)
[facility]   [facility]   Imported By: Roderic Ovens 08/25/2010 11:32:26  _____________________________________________________________________  External Attachment:    Type:   Image     Comment:   External Document

## 2010-10-21 ENCOUNTER — Telehealth: Payer: Self-pay | Admitting: Cardiovascular Disease

## 2010-10-25 NOTE — Progress Notes (Signed)
Summary: SOB, chest pain (currently not having symptoms)  Phone Note Call from Patient Call back at [facility] Phone [phone]   Caller: Patient Call For: Irish Piech/nurse Summary of Call: pt c/o SOB and sharp pains in chest. Left arm is tingling and occasional back pains. Been going on for several weeks now. Pt is currently not having symptoms.  Initial call taken by: Lysbeth Galas CMA,  October 21, 2010 9:52 AM  Follow-up for Phone Call        Spoke to pt, he is currently not having any symptoms. I scheduled pt for f/u appt with Dr. Mariah Milling for 10/27/10 next Thursday and told pt to call me sooner if he has any occurence of symptoms. Advised pt to go to ER if C.P./SOB occur over the weekend. Pt ok with this. Follow-up by: Lanny Hurst RN,  October 21, 2010 5:40 PM

## 2010-10-27 ENCOUNTER — Ambulatory Visit: Payer: Self-pay | Admitting: Cardiovascular Disease

## 2010-10-31 ENCOUNTER — Telehealth (INDEPENDENT_AMBULATORY_CARE_PROVIDER_SITE_OTHER): Payer: Self-pay | Admitting: *Deleted

## 2010-11-08 NOTE — Progress Notes (Signed)
  Phone Note Other Incoming   Request: Send information Summary of Call: Request for records received from [facility]. Request forwarded to [employer].  Jared Williams

## 2010-11-14 LAB — CBC
HCT: 30.9 % — ABNORMAL LOW (ref 39.0–52.0)
HCT: 31.6 % — ABNORMAL LOW (ref 39.0–52.0)
HCT: 32.6 % — ABNORMAL LOW (ref 39.0–52.0)
HCT: 33.1 % — ABNORMAL LOW (ref 39.0–52.0)
HCT: 34.4 % — ABNORMAL LOW (ref 39.0–52.0)
HCT: 36.7 % — ABNORMAL LOW (ref 39.0–52.0)
HCT: 37.5 % — ABNORMAL LOW (ref 39.0–52.0)
HCT: 37.5 % — ABNORMAL LOW (ref 39.0–52.0)
HCT: 39.9 % (ref 39.0–52.0)
Hemoglobin: 11 g/dL — ABNORMAL LOW (ref 13.0–17.0)
Hemoglobin: 11.1 g/dL — ABNORMAL LOW (ref 13.0–17.0)
Hemoglobin: 11.5 g/dL — ABNORMAL LOW (ref 13.0–17.0)
Hemoglobin: 11.6 g/dL — ABNORMAL LOW (ref 13.0–17.0)
Hemoglobin: 11.7 g/dL — ABNORMAL LOW (ref 13.0–17.0)
Hemoglobin: 12.5 g/dL — ABNORMAL LOW (ref 13.0–17.0)
Hemoglobin: 13 g/dL (ref 13.0–17.0)
Hemoglobin: 13.7 g/dL (ref 13.0–17.0)
MCHC: 33.8 g/dL (ref 30.0–36.0)
MCHC: 35.1 g/dL (ref 30.0–36.0)
MCHC: 35.1 g/dL (ref 30.0–36.0)
MCHC: 35.3 g/dL (ref 30.0–36.0)
MCHC: 35.4 g/dL (ref 30.0–36.0)
MCHC: 35.4 g/dL (ref 30.0–36.0)
MCHC: 35.5 g/dL (ref 30.0–36.0)
MCV: 86.2 fL (ref 78.0–100.0)
MCV: 86.2 fL (ref 78.0–100.0)
MCV: 86.2 fL (ref 78.0–100.0)
MCV: 86.9 fL (ref 78.0–100.0)
MCV: 86.9 fL (ref 78.0–100.0)
MCV: 87 fL (ref 78.0–100.0)
MCV: 87.5 fL (ref 78.0–100.0)
Platelets: 108 K/uL — ABNORMAL LOW (ref 150–400)
Platelets: 115 K/uL — ABNORMAL LOW (ref 150–400)
Platelets: 120 K/uL — ABNORMAL LOW (ref 150–400)
Platelets: 123 10*3/uL — ABNORMAL LOW (ref 150–400)
Platelets: 133 K/uL — ABNORMAL LOW (ref 150–400)
Platelets: 142 10*3/uL — ABNORMAL LOW (ref 150–400)
Platelets: 150 10*3/uL (ref 150–400)
Platelets: 159 10*3/uL (ref 150–400)
Platelets: 165 10*3/uL (ref 150–400)
Platelets: 167 10*3/uL (ref 150–400)
RBC: 3.58 MIL/uL — ABNORMAL LOW (ref 4.22–5.81)
RBC: 3.64 MIL/uL — ABNORMAL LOW (ref 4.22–5.81)
RBC: 3.78 MIL/uL — ABNORMAL LOW (ref 4.22–5.81)
RBC: 3.84 MIL/uL — ABNORMAL LOW (ref 4.22–5.81)
RBC: 3.96 MIL/uL — ABNORMAL LOW (ref 4.22–5.81)
RBC: 4.14 MIL/uL — ABNORMAL LOW (ref 4.22–5.81)
RBC: 4.22 MIL/uL (ref 4.22–5.81)
RBC: 4.31 MIL/uL (ref 4.22–5.81)
RBC: 4.35 MIL/uL (ref 4.22–5.81)
RBC: 4.57 MIL/uL (ref 4.22–5.81)
RDW: 13 % (ref 11.5–15.5)
RDW: 13.1 % (ref 11.5–15.5)
RDW: 13.4 % (ref 11.5–15.5)
RDW: 13.5 % (ref 11.5–15.5)
RDW: 13.7 % (ref 11.5–15.5)
RDW: 13.7 % (ref 11.5–15.5)
RDW: 13.7 % (ref 11.5–15.5)
RDW: 13.9 % (ref 11.5–15.5)
WBC: 10.3 K/uL (ref 4.0–10.5)
WBC: 11.2 K/uL — ABNORMAL HIGH (ref 4.0–10.5)
WBC: 11.3 10*3/uL — ABNORMAL HIGH (ref 4.0–10.5)
WBC: 11.5 K/uL — ABNORMAL HIGH (ref 4.0–10.5)
WBC: 12.1 K/uL — ABNORMAL HIGH (ref 4.0–10.5)
WBC: 14.1 10*3/uL — ABNORMAL HIGH (ref 4.0–10.5)
WBC: 5.9 10*3/uL (ref 4.0–10.5)
WBC: 5.9 10*3/uL (ref 4.0–10.5)
WBC: 6.8 10*3/uL (ref 4.0–10.5)

## 2010-11-14 LAB — COMPREHENSIVE METABOLIC PANEL WITH GFR
ALT: 29 U/L (ref 0–53)
AST: 36 U/L (ref 0–37)
Albumin: 3.8 g/dL (ref 3.5–5.2)
Alkaline Phosphatase: 77 U/L (ref 39–117)
BUN: 7 mg/dL (ref 6–23)
CO2: 23 meq/L (ref 19–32)
Calcium: 9 mg/dL (ref 8.4–10.5)
Chloride: 105 meq/L (ref 96–112)
Creatinine, Ser: 0.66 mg/dL (ref 0.4–1.5)
GFR calc non Af Amer: >60 mL/min
Glucose, Bld: 83 mg/dL (ref 70–99)
Potassium: 3.9 meq/L (ref 3.5–5.1)
Sodium: 134 meq/L — ABNORMAL LOW (ref 135–145)
Total Bilirubin: 0.7 mg/dL (ref 0.3–1.2)
Total Protein: 8 g/dL (ref 6.0–8.3)

## 2010-11-14 LAB — URINALYSIS, ROUTINE W REFLEX MICROSCOPIC
Glucose, UA: NEGATIVE mg/dL
Ketones, ur: NEGATIVE mg/dL
Nitrite: NEGATIVE
Protein, ur: NEGATIVE mg/dL

## 2010-11-14 LAB — POCT I-STAT 4, (NA,K, GLUC, HGB,HCT)
Glucose, Bld: 104 mg/dL — ABNORMAL HIGH (ref 70–99)
Glucose, Bld: 113 mg/dL — ABNORMAL HIGH (ref 70–99)
Glucose, Bld: 86 mg/dL (ref 70–99)
Glucose, Bld: 88 mg/dL (ref 70–99)
Glucose, Bld: 90 mg/dL (ref 70–99)
HCT: 28 % — ABNORMAL LOW (ref 39.0–52.0)
HCT: 33 % — ABNORMAL LOW (ref 39.0–52.0)
HCT: 33 % — ABNORMAL LOW (ref 39.0–52.0)
HCT: 34 % — ABNORMAL LOW (ref 39.0–52.0)
HCT: 38 % — ABNORMAL LOW (ref 39.0–52.0)
Hemoglobin: 11.2 g/dL — ABNORMAL LOW (ref 13.0–17.0)
Hemoglobin: 11.2 g/dL — ABNORMAL LOW (ref 13.0–17.0)
Hemoglobin: 11.6 g/dL — ABNORMAL LOW (ref 13.0–17.0)
Hemoglobin: 12.9 g/dL — ABNORMAL LOW (ref 13.0–17.0)
Hemoglobin: 9.2 g/dL — ABNORMAL LOW (ref 13.0–17.0)
Potassium: 3.7 meq/L (ref 3.5–5.1)
Potassium: 4.2 meq/L (ref 3.5–5.1)
Potassium: 4.4 meq/L (ref 3.5–5.1)
Potassium: 4.9 meq/L (ref 3.5–5.1)
Potassium: 5.3 mEq/L — ABNORMAL HIGH (ref 3.5–5.1)
Sodium: 134 mEq/L — ABNORMAL LOW (ref 135–145)
Sodium: 134 meq/L — ABNORMAL LOW (ref 135–145)
Sodium: 136 mEq/L (ref 135–145)
Sodium: 137 meq/L (ref 135–145)
Sodium: 137 meq/L (ref 135–145)
Sodium: 138 meq/L (ref 135–145)

## 2010-11-14 LAB — HEMOGLOBIN AND HEMATOCRIT, BLOOD
HCT: 28.4 % — ABNORMAL LOW (ref 39.0–52.0)
Hemoglobin: 10.1 g/dL — ABNORMAL LOW (ref 13.0–17.0)

## 2010-11-14 LAB — BASIC METABOLIC PANEL WITH GFR
BUN: 8 mg/dL (ref 6–23)
BUN: 8 mg/dL (ref 6–23)
BUN: 9 mg/dL (ref 6–23)
CO2: 23 meq/L (ref 19–32)
CO2: 23 meq/L (ref 19–32)
CO2: 25 meq/L (ref 19–32)
Calcium: 8.3 mg/dL — ABNORMAL LOW (ref 8.4–10.5)
Calcium: 8.3 mg/dL — ABNORMAL LOW (ref 8.4–10.5)
Calcium: 8.4 mg/dL (ref 8.4–10.5)
Chloride: 102 meq/L (ref 96–112)
Chloride: 104 meq/L (ref 96–112)
Chloride: 105 meq/L (ref 96–112)
Creatinine, Ser: 0.76 mg/dL (ref 0.4–1.5)
Creatinine, Ser: 0.79 mg/dL (ref 0.4–1.5)
Creatinine, Ser: 0.87 mg/dL (ref 0.4–1.5)
GFR calc non Af Amer: >60 mL/min
GFR calc non Af Amer: >60 mL/min
GFR calc non Af Amer: >60 mL/min
Glucose, Bld: 102 mg/dL — ABNORMAL HIGH (ref 70–99)
Glucose, Bld: 79 mg/dL (ref 70–99)
Glucose, Bld: 97 mg/dL (ref 70–99)
Potassium: 3.7 meq/L (ref 3.5–5.1)
Potassium: 3.7 meq/L (ref 3.5–5.1)
Potassium: 4 meq/L (ref 3.5–5.1)
Sodium: 133 meq/L — ABNORMAL LOW (ref 135–145)
Sodium: 134 meq/L — ABNORMAL LOW (ref 135–145)
Sodium: 135 meq/L (ref 135–145)

## 2010-11-14 LAB — PLATELET COUNT: Platelets: 120 K/uL — ABNORMAL LOW (ref 150–400)

## 2010-11-14 LAB — DIFFERENTIAL
Basophils Absolute: 0 10*3/uL (ref 0.0–0.1)
Eosinophils Absolute: 0.1 10*3/uL (ref 0.0–0.7)
Eosinophils Relative: 2 % (ref 0–5)
Lymphocytes Relative: 48 % — ABNORMAL HIGH (ref 12–46)
Monocytes Absolute: 0.4 10*3/uL (ref 0.1–1.0)
Neutro Abs: 2.6 10*3/uL (ref 1.7–7.7)

## 2010-11-14 LAB — BASIC METABOLIC PANEL
BUN: 7 mg/dL (ref 6–23)
BUN: 8 mg/dL (ref 6–23)
CO2: 26 mEq/L (ref 19–32)
CO2: 27 mEq/L (ref 19–32)
Calcium: 8.4 mg/dL (ref 8.4–10.5)
Calcium: 9 mg/dL (ref 8.4–10.5)
Calcium: 9.4 mg/dL (ref 8.4–10.5)
Creatinine, Ser: 0.75 mg/dL (ref 0.4–1.5)
Creatinine, Ser: 0.81 mg/dL (ref 0.4–1.5)
GFR calc Af Amer: >60 mL/min (ref >60)
GFR calc Af Amer: >60 mL/min (ref >60)
GFR calc non Af Amer: >60 mL/min (ref >60)
GFR calc non Af Amer: >60 mL/min (ref >60)
Potassium: 3.8 mEq/L (ref 3.5–5.1)
Sodium: 136 mEq/L (ref 135–145)

## 2010-11-14 LAB — POCT I-STAT GLUCOSE: Operator id: 125961

## 2010-11-14 LAB — BLOOD GAS, ARTERIAL
Acid-base deficit: 1.5 mmol/L (ref 0.0–2.0)
Bicarbonate: 22.3 mEq/L (ref 20.0–24.0)
Drawn by: 31530
FIO2: 0.21 %
O2 Saturation: 94.2 %
Patient temperature: 98.6
TCO2: 23.4 mmol/L (ref 0–100)
pCO2 arterial: 34.8 mmHg — ABNORMAL LOW (ref 35.0–45.0)
pH, Arterial: 7.424 (ref 7.350–7.450)
pO2, Arterial: 67.8 mmHg — ABNORMAL LOW (ref 80.0–100.0)

## 2010-11-14 LAB — POCT I-STAT, CHEM 8
BUN: 10 mg/dL (ref 6–23)
BUN: 6 mg/dL (ref 6–23)
Calcium, Ion: 1.16 mmol/L (ref 1.12–1.32)
Chloride: 103 mEq/L (ref 96–112)
Chloride: 107 mEq/L (ref 96–112)
HCT: 32 % — ABNORMAL LOW (ref 39.0–52.0)
HCT: 33 % — ABNORMAL LOW (ref 39.0–52.0)
Potassium: 3.9 mEq/L (ref 3.5–5.1)
Potassium: 4 mEq/L (ref 3.5–5.1)
Sodium: 136 mEq/L (ref 135–145)

## 2010-11-14 LAB — GLUCOSE, CAPILLARY
Glucose-Capillary: 101 mg/dL — ABNORMAL HIGH (ref 70–99)
Glucose-Capillary: 101 mg/dL — ABNORMAL HIGH (ref 70–99)
Glucose-Capillary: 103 mg/dL — ABNORMAL HIGH (ref 70–99)
Glucose-Capillary: 110 mg/dL — ABNORMAL HIGH (ref 70–99)
Glucose-Capillary: 79 mg/dL (ref 70–99)
Glucose-Capillary: 89 mg/dL (ref 70–99)
Glucose-Capillary: 90 mg/dL (ref 70–99)

## 2010-11-14 LAB — CK TOTAL AND CKMB (NOT AT ARMC)
CK, MB: 9.2 ng/mL (ref 0.3–4.0)
Relative Index: 3.1 — ABNORMAL HIGH (ref 0.0–2.5)
Total CK: 296 U/L — ABNORMAL HIGH (ref 7–232)

## 2010-11-14 LAB — CREATININE, SERUM
Creatinine, Ser: 0.66 mg/dL (ref 0.4–1.5)
Creatinine, Ser: 0.76 mg/dL (ref 0.4–1.5)
GFR calc Af Amer: >60 mL/min (ref >60)
GFR calc non Af Amer: >60 mL/min
GFR calc non Af Amer: >60 mL/min (ref >60)

## 2010-11-14 LAB — HEPARIN LEVEL (UNFRACTIONATED): Heparin Unfractionated: 0.34 IU/mL (ref 0.30–0.70)

## 2010-11-14 LAB — POCT I-STAT 3, ART BLOOD GAS (G3+)
Acid-base deficit: 1 mmol/L (ref 0.0–2.0)
Acid-base deficit: 4 mmol/L — ABNORMAL HIGH (ref 0.0–2.0)
Acid-base deficit: 7 mmol/L — ABNORMAL HIGH (ref 0.0–2.0)
Bicarbonate: 18.1 mEq/L — ABNORMAL LOW (ref 20.0–24.0)
Bicarbonate: 22 meq/L (ref 20.0–24.0)
Bicarbonate: 23.3 mEq/L (ref 20.0–24.0)
O2 Saturation: 94 %
Patient temperature: 97
TCO2: 23 mmol/L (ref 0–100)
pCO2 arterial: 33.1 mmHg — ABNORMAL LOW (ref 35.0–45.0)
pCO2 arterial: 37.7 mmHg (ref 35.0–45.0)
pCO2 arterial: 40.1 mmHg (ref 35.0–45.0)
pH, Arterial: 7.343 — ABNORMAL LOW (ref 7.350–7.450)
pH, Arterial: 7.399 (ref 7.350–7.450)
pO2, Arterial: 324 mmHg — ABNORMAL HIGH (ref 80.0–100.0)
pO2, Arterial: 73 mmHg — ABNORMAL LOW (ref 80.0–100.0)
pO2, Arterial: 74 mmHg — ABNORMAL LOW (ref 80.0–100.0)

## 2010-11-14 LAB — PROTIME-INR
INR: 1 (ref 0.00–1.49)
INR: 1.28 (ref 0.00–1.49)
Prothrombin Time: 13.1 s (ref 11.6–15.2)
Prothrombin Time: 15.9 s — ABNORMAL HIGH (ref 11.6–15.2)

## 2010-11-14 LAB — CROSSMATCH
ABO/RH(D): O POS
Antibody Screen: NEGATIVE

## 2010-11-14 LAB — MRSA CULTURE

## 2010-11-14 LAB — APTT
aPTT: 33 seconds (ref 24–37)
aPTT: 81 seconds — ABNORMAL HIGH (ref 24–37)

## 2010-11-14 LAB — MAGNESIUM
Magnesium: 2.1 mg/dL (ref 1.5–2.5)
Magnesium: 2.3 mg/dL (ref 1.5–2.5)
Magnesium: 2.6 mg/dL — ABNORMAL HIGH (ref 1.5–2.5)

## 2010-11-14 LAB — MRSA PCR SCREENING: MRSA by PCR: NEGATIVE

## 2010-11-14 LAB — ABO/RH: ABO/RH(D): O POS

## 2010-12-14 DIAGNOSIS — I209 Angina pectoris, unspecified: Secondary | ICD-10-CM | POA: Insufficient documentation

## 2011-01-10 NOTE — Assessment & Plan Note (Signed)
OFFICE VISIT   Jared Williams, Jared Williams  DOB:  14-Sep-1970                                        October 14, 2009  CHART #:  [id number]   HISTORY:  The patient returns to the office today for followup visit  after recent acute myocardial infarction and probable old myocardial  infarction with apical left ventricular aneurysm.  On September 20, 2009,  he underwent coronary artery bypass grafting with left internal mammary  to the LAD and a reverse saphenous vein graft to the posterior  descending and repair of apical LV aneurysm.  Preoperatively, he was  noted to have significantly depressed LV function in the 30% range and  was in heart failure on presentation.  Since discharge, he appears to be  making very good progress.  He is going to start a cardiac rehab program  next week.  He denies any shortness of breath or overt symptoms of  congestive heart failure.  He still has some fatigue but notes that he  has been continuing to lose some weight and also had been a heavy smoker  prior to surgery and so far has remained off cigarettes using the  nicotine patches tapering.   PHYSICAL EXAMINATION:  Today, his blood pressure 102/64, pulse is 64 and  regular, respiratory rate is 16, O2 sats 98%.  His sternum is stable and  well healed without any evidence of drainage.  Lungs are clear  bilaterally.  He has no pedal edema in the lower extremities.   DIAGNOSTIC TESTS:  Followup chest x-ray shows clear lung fields.   MEDICATIONS:  He continues on amiodarone 200 mg twice a day, aspirin 325  mg a day, Coreg 25 twice a day, lisinopril 2.5 a day, tapering nicotine  patch as he is currently on the 14 mcg patches.  He is also on  rosuvastatin 40 mg a day, spironolactone 12.5 mg a day, and Klonopin  p.r.n. t.i.d.   ASSESSMENT AND PLAN:  I have asked him to decrease his amiodarone to 200  mg once a day and discussed with Dr. Shirlee Latch on his next visit when to  stop it and also  soon Dr. Shirlee Latch will do a followup echo to evaluate his  left ventricular function in the coming weeks to months to get a  baseline on his current left ventricular function post  revascularization.  The patient preoperatively had very poor dentition.  He was strongly encouraged to get this taken care of.  He has no acute  pain or loose teeth currently, so it would be ideal to wait about 3  months following surgery before having any dental extractions done.  The  patient has no primary care physician.  He will discuss this with Dr.  Shirlee Latch and possibly can get primary care doctors through the [facility] in [place].   Sheliah Plane, MD  Electronically Signed   EG/MEDQ  D:  10/14/2009  T:  10/15/2009  Job:  [id number]   cc:   Marca Ancona, MD

## 2011-01-13 IMAGING — CR DG CHEST 2V
1 series · 2 of 2 positions shown · non-contrast
Comparison: none

REASON FOR EXAM: chest pain
COMMENTS:

[Series 1: view not recorded · 0.17mm/px · 2 of 2 slices shown]
[im 1/2]
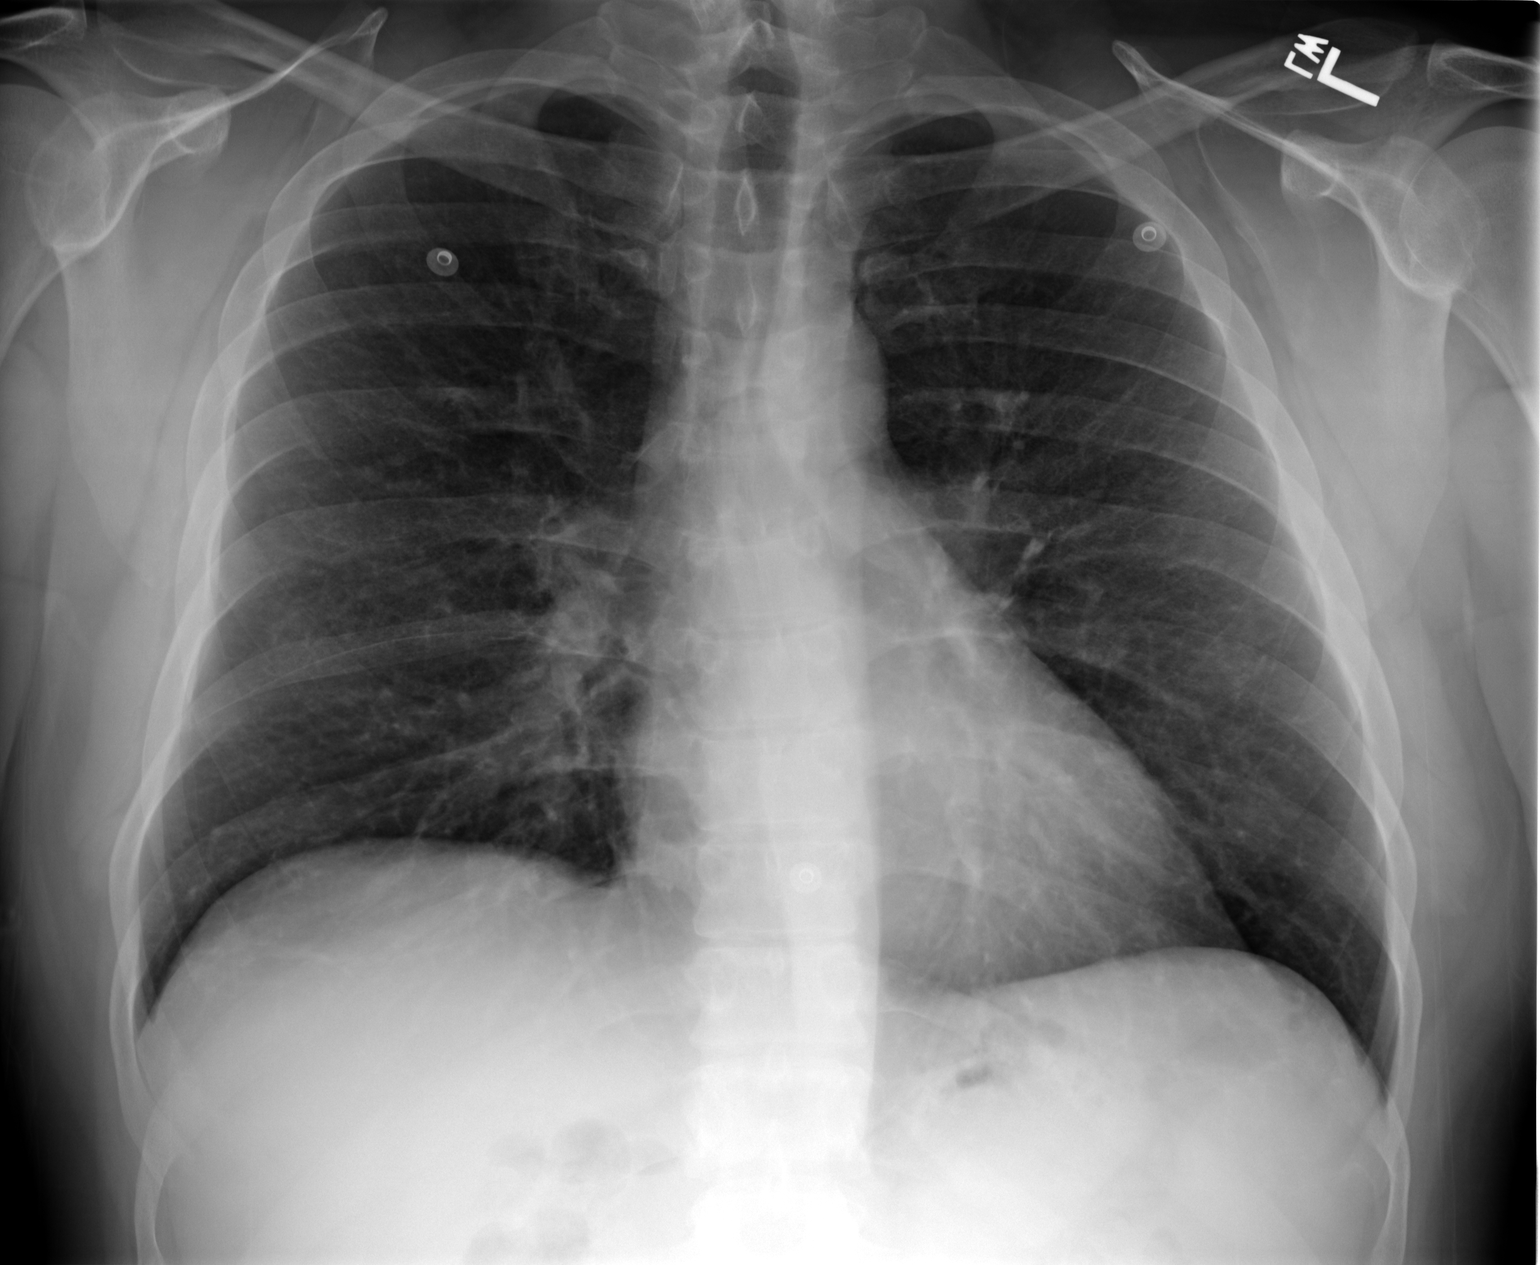
[im 2/2]
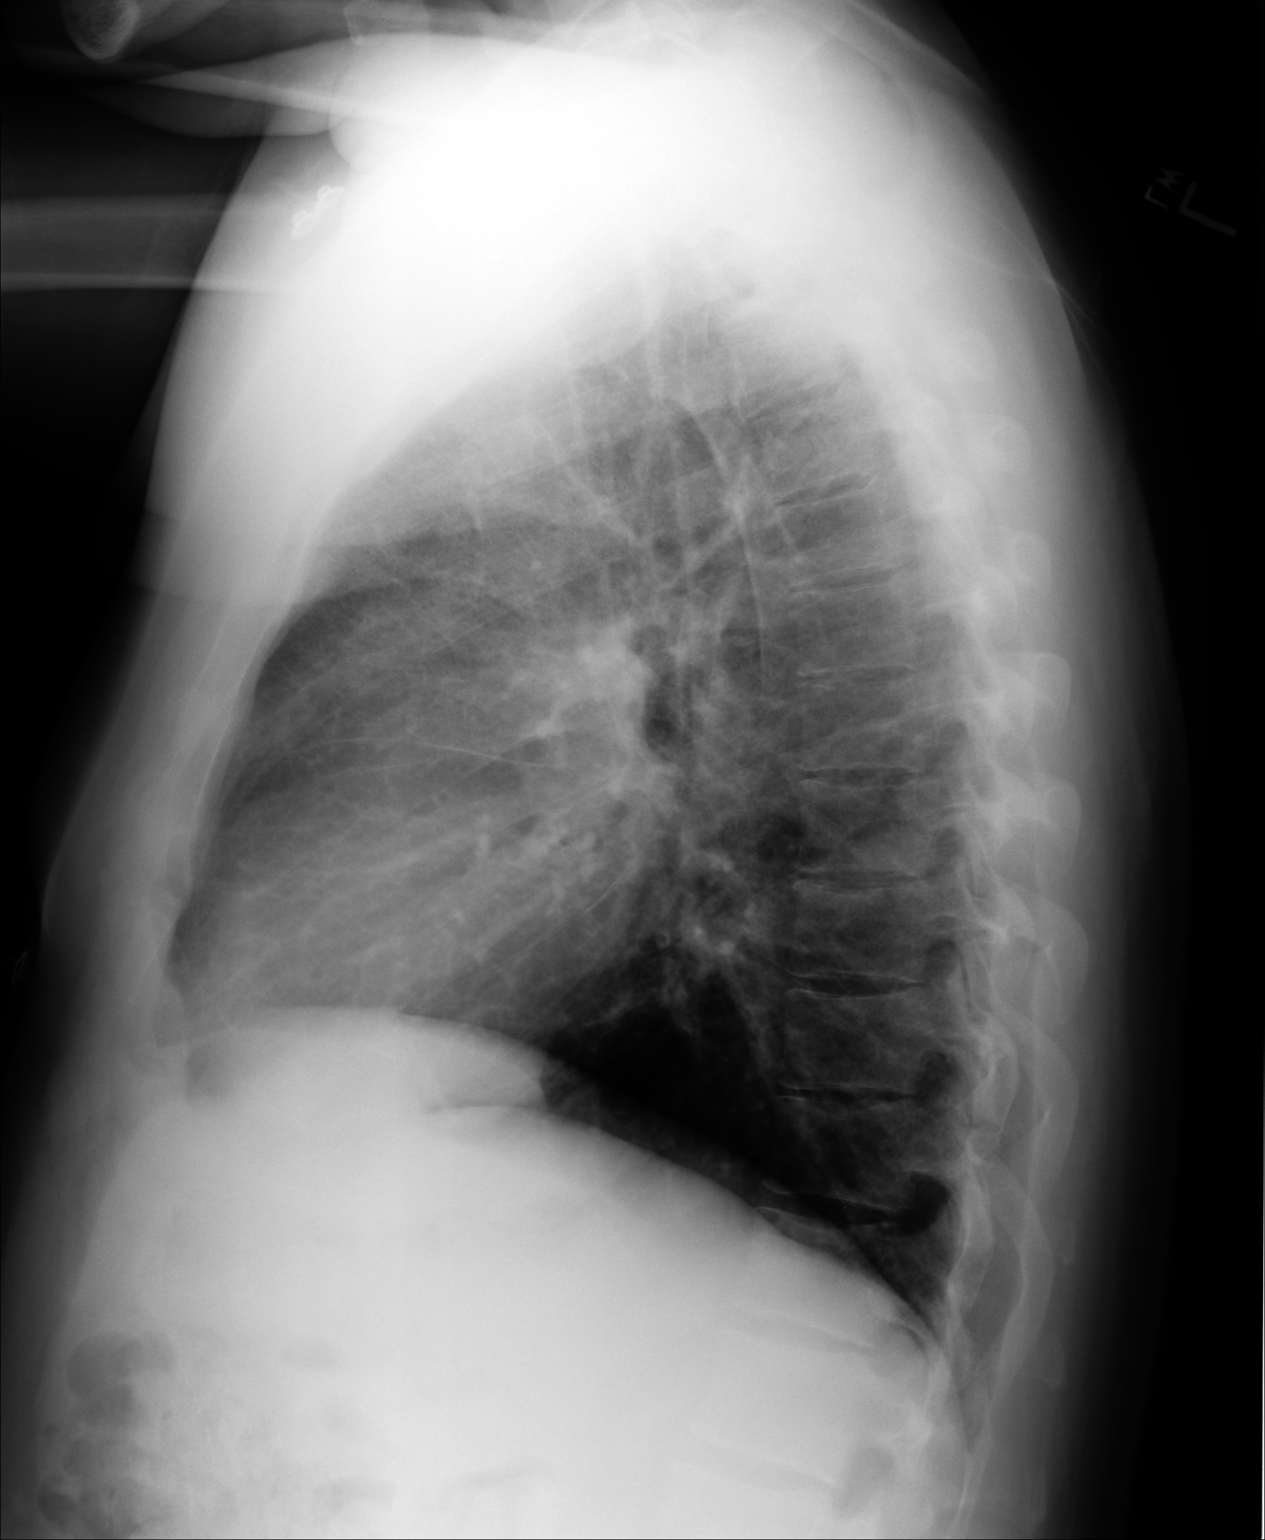

[2 of 2 positions shown; findings below may reference images not displayed]

PROCEDURE:     DXR - DXR CHEST PA (OR AP) AND LATERAL  - September 14, 2009  [DATE]

RESULT:     Comparison is made to study 20 April, 2009.

The lungs are well-expanded. The interstitial markings are minimally
prominent but stable. There is no focal infiltrate. The cardiac silhouette
is normal in size. There is no pleural effusion or pneumothorax. The
mediastinum is not widened. The bony thorax exhibits no acute abnormality.
IMPRESSION: I do not see evidence of CHF nor of pneumonia. I cannot
exclude an element of underlying reactive airway disease. Is there a smoking
history?

## 2011-01-19 IMAGING — CR DG CHEST 1V PORT
1 series · 1 of 1 positions shown · non-contrast
Comparison: Two-view chest x-ray yesterday.

CLINICAL DATA: Postop CABG for coronary artery disease.

PORTABLE CHEST - 1 VIEW [DATE]/[id number] hours:

[view not recorded]
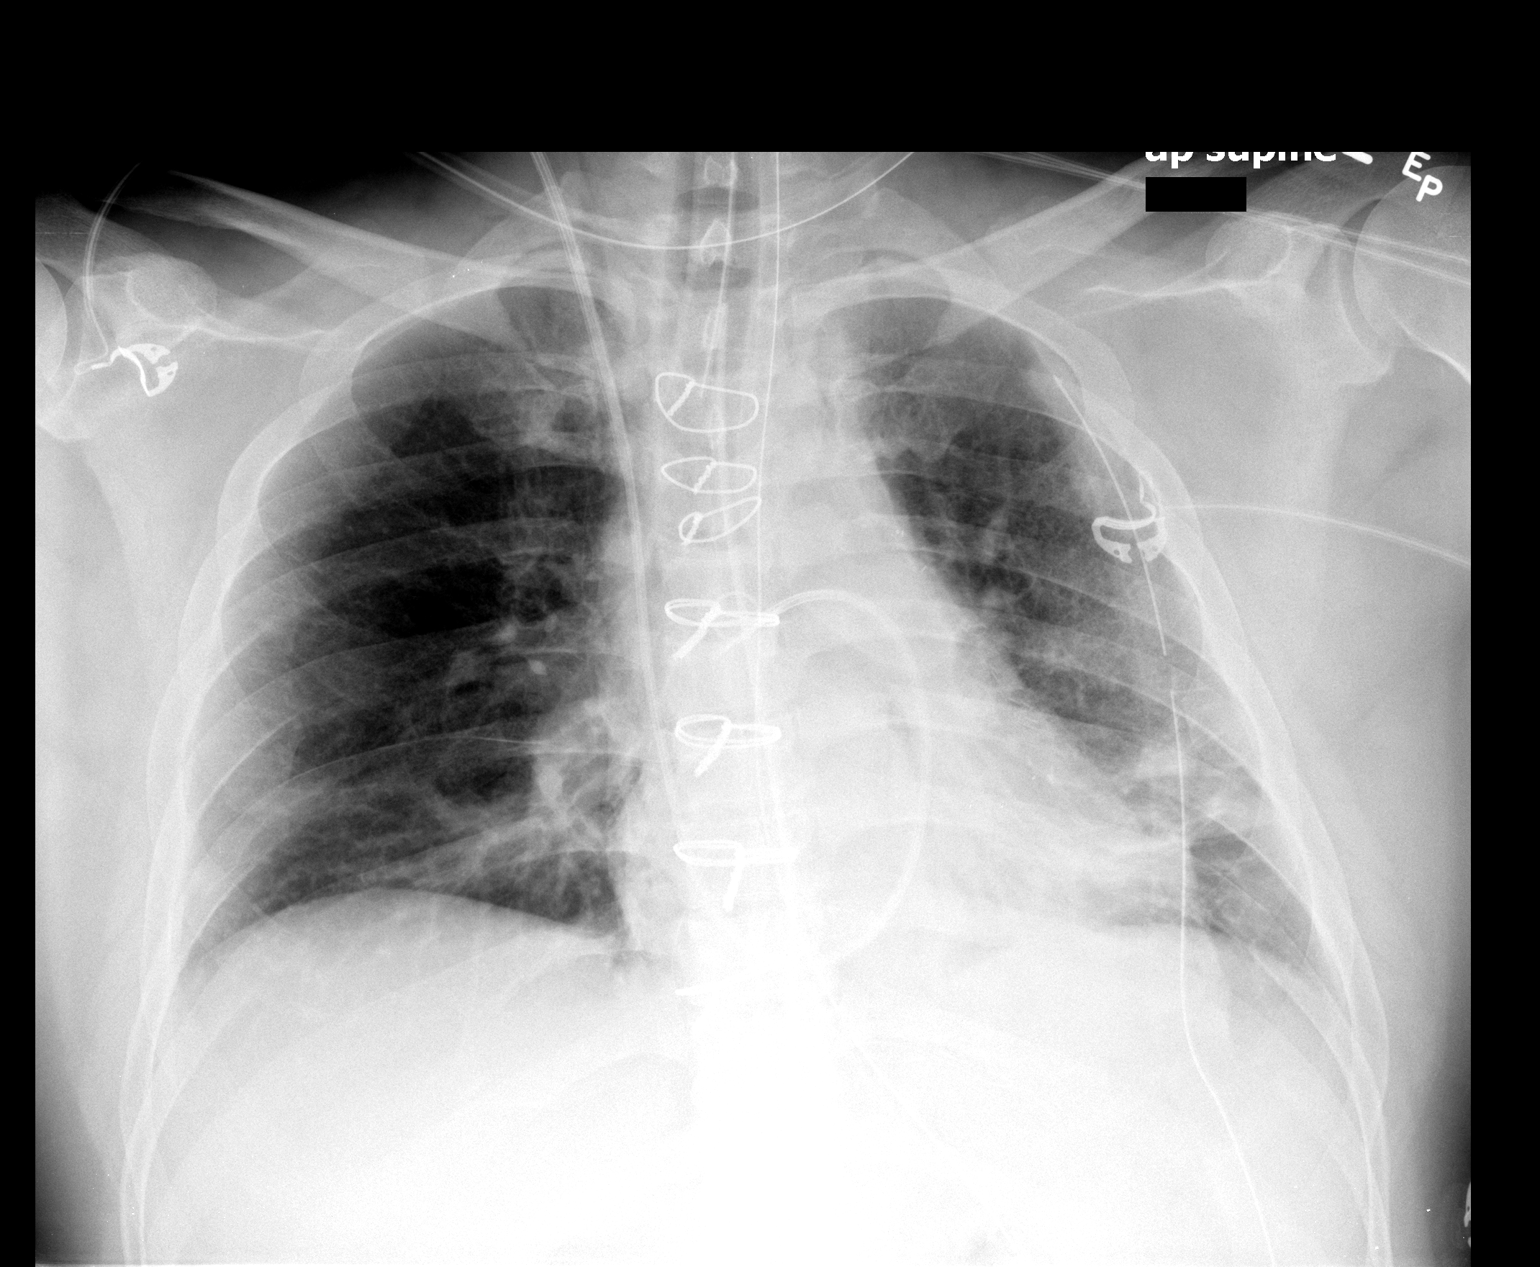

[1 of 1 positions shown; findings below may reference images not displayed]

FINDINGS: Interval sternotomy for CABG.  Heart size normal and
stable.  Endotracheal tube tip in satisfactory position
approximately 6 cm above the carina.  Swan-Ganz catheter tip in the
proximal right main pulmonary artery.  Left chest tube with no
pneumothorax.  Nasogastric tube courses below the diaphragm into
the stomach.  Mild atelectasis at the left lung base.  Lungs
otherwise clear.  No evidence of pulmonary edema.
IMPRESSION: Support apparatus satisfactory.  No pneumothorax.  Mild left
basilar atelectasis.  Satisfactory post-operative chest.

## 2011-01-20 IMAGING — CR DG CHEST 1V PORT
1 series · 1 of 1 positions shown · non-contrast
Comparison: 09/20/2009

CLINICAL DATA: Coronary artery disease.  Status post CABG.

PORTABLE CHEST - 1 VIEW

[AP]
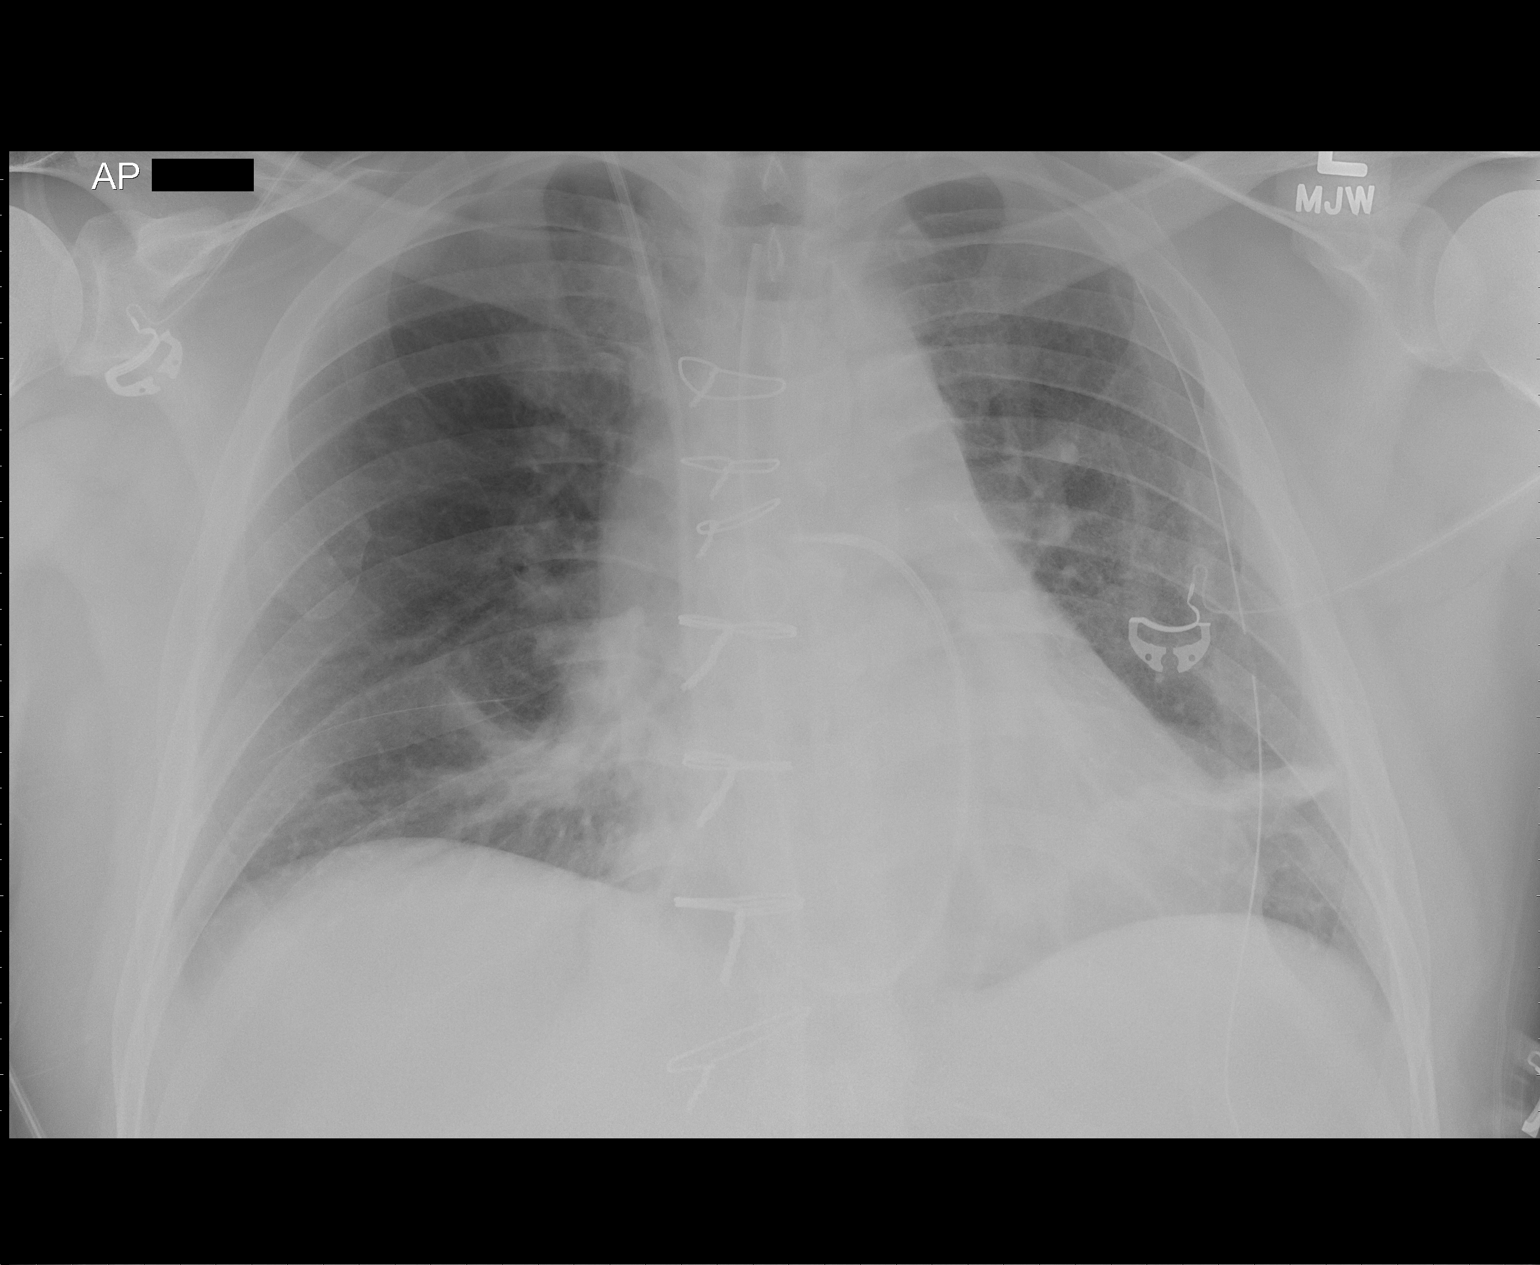

[1 of 1 positions shown; findings below may reference images not displayed]

FINDINGS: Endotracheal and nasogastric tubes have been removed.
Mediastinal drain, left-sided chest tube, and Swan-Ganz catheter
remain in place.

Low lung volumes are present, causing crowding of the pulmonary
vasculature.  There is mild persistent bibasilar atelectasis.  No
pneumothorax identified.  No overt edema.  Overall the lingular and
left lower lobe atelectasis is mildly improved.
IMPRESSION: 1.  Postop day 1 status post CABG, with removal of the nasogastric
and endotracheal tubes.  Improved aeration at the left lung base.

## 2011-01-22 IMAGING — CR DG CHEST 2V
2 series · 2 of 2 positions shown · non-contrast
Comparison: 09/22/2009

CLINICAL DATA: Coronary artery disease status post CABG, shortness
of breath

CHEST - 2 VIEW

[w chest pa]
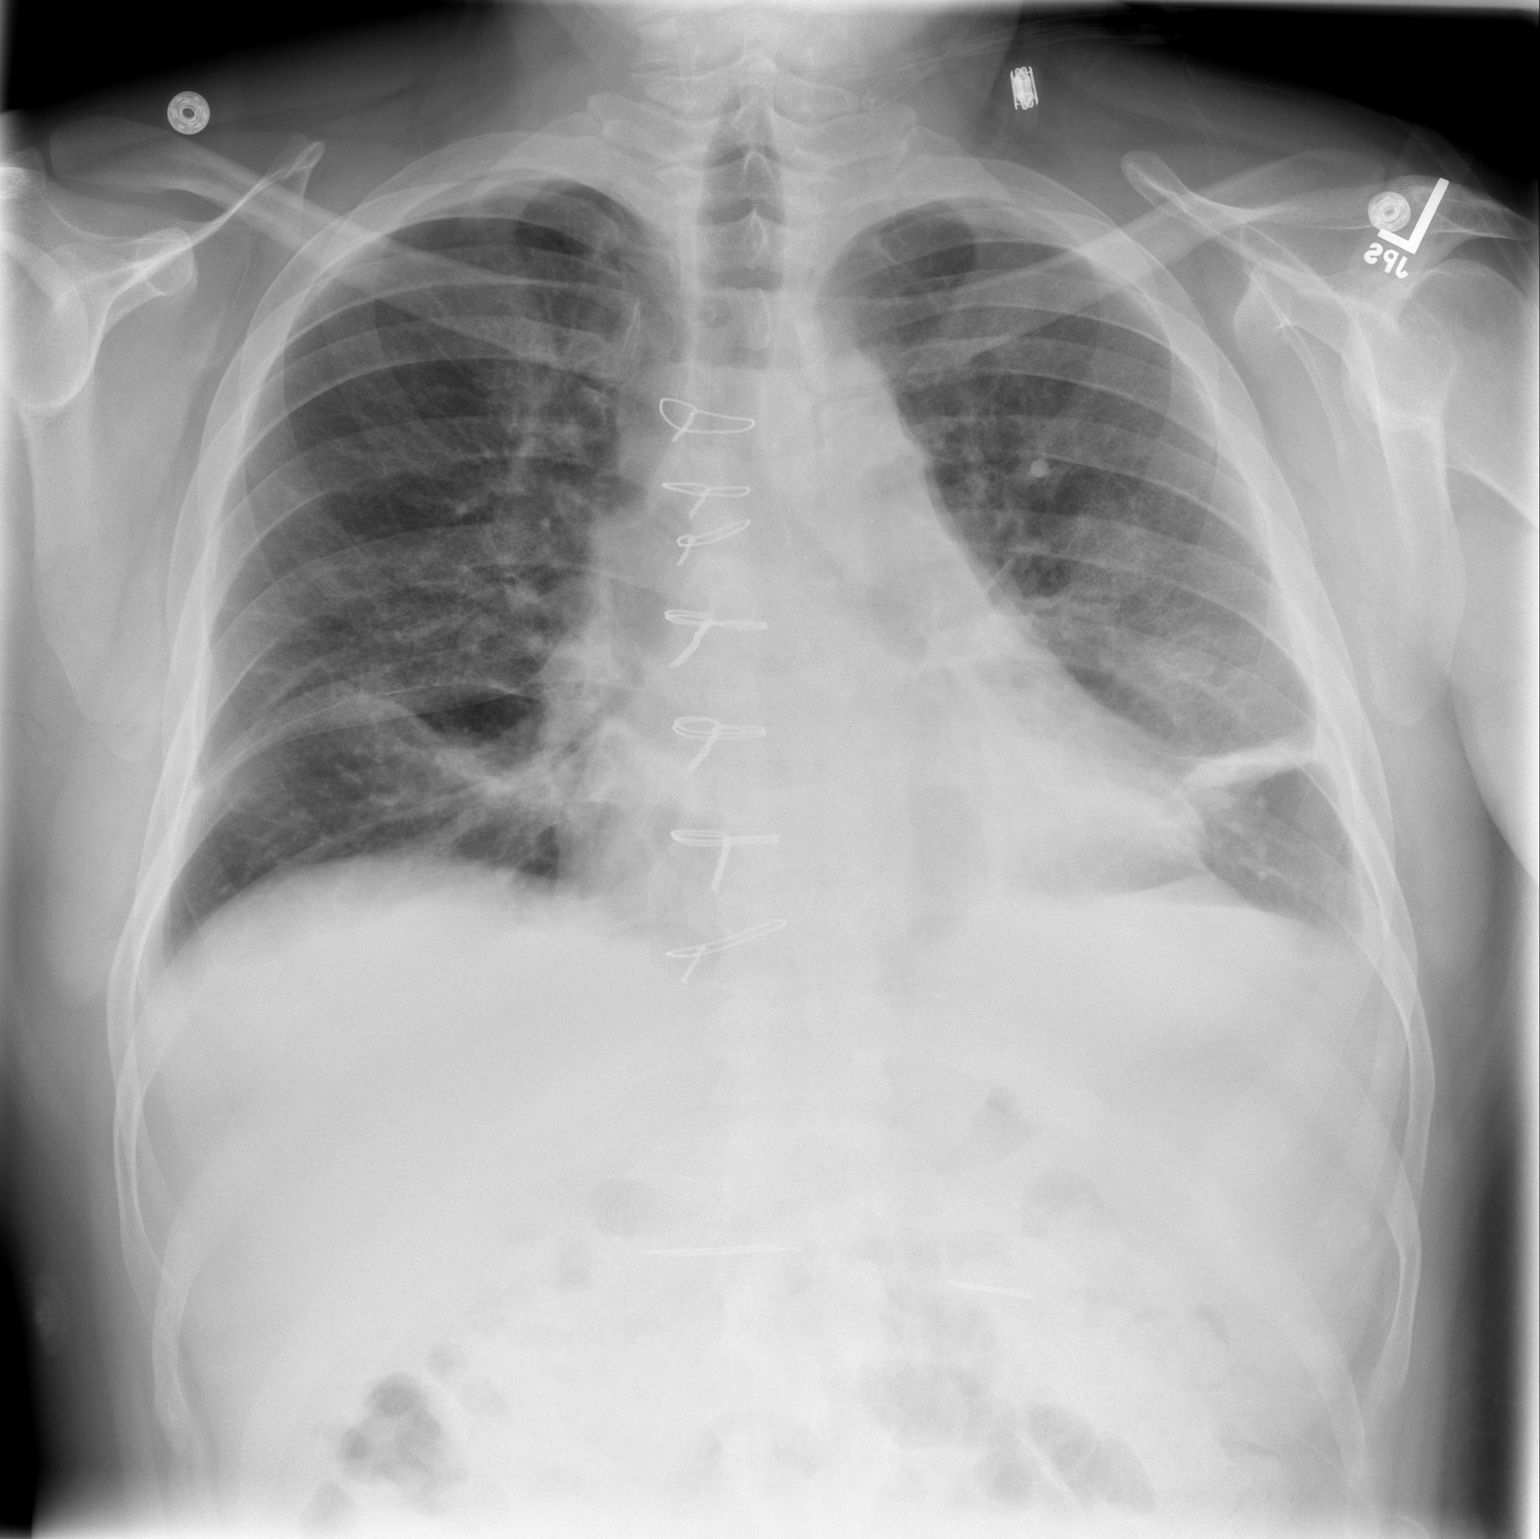

[w chest lat]
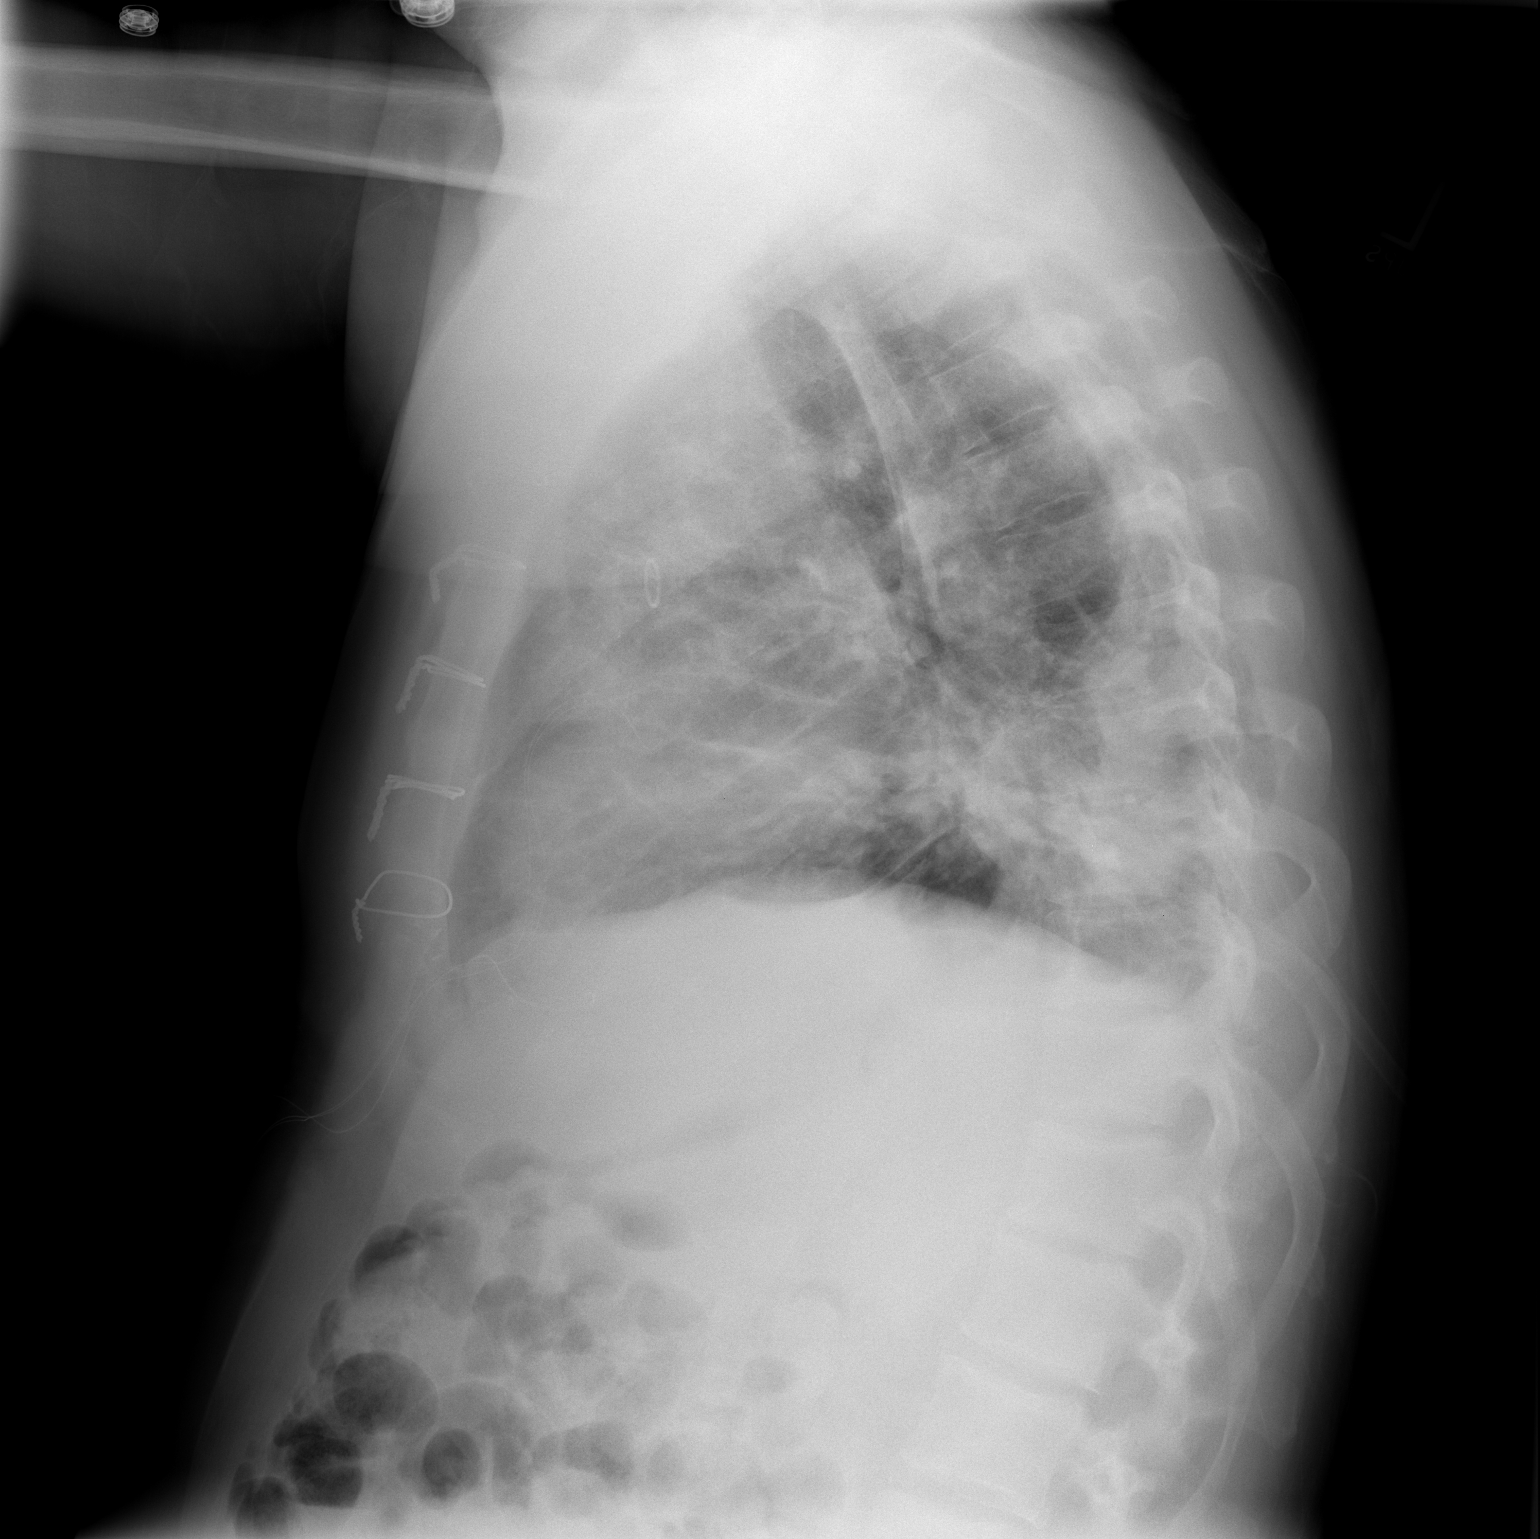

[2 of 2 positions shown; findings below may reference images not displayed]

FINDINGS: Mild enlargement cardiac silhouette status post median sternotomy
and CABG.
Pulmonary vascularity normal.
Bibasilar atelectasis.
Probable small subpulmonic left pleural effusion.
No pneumothorax.
IMPRESSION: Bibasilar atelectasis and probable small left pleural effusion.

## 2011-02-15 ENCOUNTER — Observation Stay: Payer: Self-pay | Admitting: Internal Medicine

## 2011-02-16 DIAGNOSIS — I369 Nonrheumatic tricuspid valve disorder, unspecified: Secondary | ICD-10-CM

## 2011-02-20 ENCOUNTER — Encounter: Payer: Self-pay | Admitting: Cardiovascular Disease

## 2011-03-16 ENCOUNTER — Encounter: Payer: Self-pay | Admitting: Cardiology

## 2011-04-29 ENCOUNTER — Observation Stay: Payer: Self-pay | Admitting: *Deleted

## 2011-04-29 DIAGNOSIS — R079 Chest pain, unspecified: Secondary | ICD-10-CM

## 2011-05-02 ENCOUNTER — Other Ambulatory Visit: Payer: Self-pay | Admitting: Cardiology

## 2011-05-02 ENCOUNTER — Telehealth: Payer: Self-pay | Admitting: *Deleted

## 2011-05-02 NOTE — Telephone Encounter (Signed)
Called pt to give instructions for ETT myoview. NO answer. LMOM TCB.  Asher Muir can you please schedule pt for ETT Myoview at [facility]. If he cannot do there, let me know and we can schedule at [facility], per Dr. Shirlee Latch. And also a f/u with either Dr. Shirlee Latch or Dr. Mariah Milling next week, thanks.

## 2011-05-03 NOTE — Telephone Encounter (Signed)
Attempted to call pt again, no answer x 2, LMOM. Pt will need ETT-Myoview scheduled at [facility] this week preferably per Dr. Shirlee Latch. He was recently seen at [facility]. Asher Muir can schedule his myoview, and f/u next week with either Dr. Mariah Milling or Dr. Shirlee Latch. Instructions: Hold coreg pm prior and am of test, NPO 4 hrs prior, no caffeine/decaf 12 hrs prior, may take am rx meds with sip of water, wear comfortable clothes/walking shoes. Please notify pt, thanks. Also, I have not sent for precert.

## 2011-05-05 ENCOUNTER — Telehealth: Payer: Self-pay

## 2011-05-05 NOTE — Telephone Encounter (Signed)
Notified patient need to have an ETT-myoview for chest pain and hx of CAD.  The patient does not want to have the myoview at this time; he is moving & does not feel he needs the test.  I explained to him the need for the myoview in great detail but he refused to schedule the test.  He will call back to reschedule the test when he feels the need to do so.

## 2011-05-08 NOTE — Telephone Encounter (Signed)
Notified patient need to schedule a stress myoview. The patient is in the middle of moving & would like to call back to schedule this test.  He really does not feel at this time the test is necessary. I went into great detail the importance of it.  The patient will call back to schedule.

## 2011-05-09 NOTE — Telephone Encounter (Signed)
FYI

## 2011-05-12 ENCOUNTER — Telehealth: Payer: Self-pay | Admitting: *Deleted

## 2011-05-12 NOTE — Telephone Encounter (Signed)
Mr. Jared Williams was to have stress test done @ [facility]. Per Harriett Sine at [facility] office- call and offer pt an appointment and pt refused. He felt he didn't need the appt. And that he will call back when he is ready/saf

## 2011-05-15 ENCOUNTER — Telehealth: Payer: Self-pay | Admitting: *Deleted

## 2011-05-15 NOTE — Telephone Encounter (Signed)
Pt called back regarding scheduling myoview, see previous phone notes. He still does not think he needs this done, post hospital stay he had a lot of GI issues, feels he had food poisoning. He had a lot of "gas," "bloating," and "diarrhea," after which he felt "much better." He was told EKG at hospital showed inverted T wave, and pt does have h/o bypass grafting, NSTEMI 2011, and unexplained diaphoresis past 1-2 months per hosp note. I advised pt that he still have f/u post hospital (since he refuses myoview recc by Dr. Shirlee Latch after seeing at [facility]), and will send to Dr. Mariah Milling for recommendation whom he normally sees.

## 2011-05-16 NOTE — Telephone Encounter (Signed)
He can follow up in clinic when we have available if he would like to discuss anything

## 2011-05-19 NOTE — Telephone Encounter (Signed)
Pt has f/u 9/25 with Dr. Mariah Milling. +

## 2011-05-23 ENCOUNTER — Ambulatory Visit: Payer: Medicare FFS | Admitting: Cardiovascular Disease

## 2012-09-29 ENCOUNTER — Observation Stay: Payer: Self-pay | Admitting: Student

## 2012-09-29 DIAGNOSIS — R079 Chest pain, unspecified: Secondary | ICD-10-CM

## 2012-09-29 DIAGNOSIS — I379 Nonrheumatic pulmonary valve disorder, unspecified: Secondary | ICD-10-CM

## 2012-09-29 LAB — TROPONIN I: Troponin-I: <0.02 ng/mL

## 2012-09-29 LAB — BASIC METABOLIC PANEL
Anion Gap: 9 (ref 7–16)
BUN: 11 mg/dL (ref 7–18)
Calcium, Total: 8.5 mg/dL (ref 8.5–10.1)
Chloride: 105 mmol/L (ref 98–107)
Co2: 25 mmol/L (ref 21–32)
EGFR (African American): >60
EGFR (Non-African Amer.): >60
Glucose: 93 mg/dL (ref 65–99)
Potassium: 3.6 mmol/L (ref 3.5–5.1)

## 2012-09-29 LAB — CBC
HCT: 41.3 % (ref 40.0–52.0)
HGB: 14.4 g/dL (ref 13.0–18.0)
MCH: 29.3 pg (ref 26.0–34.0)
MCHC: 34.9 g/dL (ref 32.0–36.0)
MCV: 84 fL (ref 80–100)
RBC: 4.91 10*6/uL (ref 4.40–5.90)

## 2012-09-29 LAB — CK TOTAL AND CKMB (NOT AT ARMC): CK-MB: 0.7 ng/mL (ref 0.5–3.6)

## 2012-10-01 ENCOUNTER — Telehealth: Payer: Self-pay

## 2012-10-01 NOTE — Telephone Encounter (Signed)
"  schedule pt for stress myoview" VO Dr. Alvis Lemmings, RN

## 2012-10-01 NOTE — Telephone Encounter (Signed)
lmtcb

## 2012-10-03 ENCOUNTER — Other Ambulatory Visit: Payer: Self-pay

## 2012-10-03 DIAGNOSIS — R079 Chest pain, unspecified: Secondary | ICD-10-CM

## 2012-10-03 NOTE — Telephone Encounter (Signed)
Verbal instructions given to pt MV:[id number] Myoview Understanding verb Also mailed instructions to pt

## 2012-10-03 NOTE — Telephone Encounter (Signed)
Pt wishes to schedule stress myoview for 2/12 I will schedule and call him back with details

## 2012-10-08 ENCOUNTER — Telehealth: Payer: Self-pay

## 2012-10-08 NOTE — Telephone Encounter (Signed)
Pt called to r/s stress myoview for 2/18 d/t weather R/s to 2/18 at 730 at [facility] Pt aware

## 2012-10-15 ENCOUNTER — Ambulatory Visit: Payer: Self-pay | Admitting: Cardiovascular Disease

## 2012-10-15 DIAGNOSIS — R079 Chest pain, unspecified: Secondary | ICD-10-CM

## 2012-10-22 ENCOUNTER — Other Ambulatory Visit: Payer: Self-pay | Admitting: *Deleted

## 2013-03-21 ENCOUNTER — Emergency Department: Payer: Self-pay | Admitting: Emergency Medicine

## 2013-03-21 LAB — CBC WITH DIFFERENTIAL/PLATELET
Basophil #: 0.1 10*3/uL (ref 0.0–0.1)
Eosinophil #: 0 10*3/uL (ref 0.0–0.7)
Eosinophil %: 0.4 %
HCT: 40.1 % (ref 40.0–52.0)
HGB: 14.2 g/dL (ref 13.0–18.0)
Lymphocyte #: 2.1 10*3/uL (ref 1.0–3.6)
Lymphocyte %: 21.2 %
MCH: 29.4 pg (ref 26.0–34.0)
MCV: 83 fL (ref 80–100)
Monocyte #: 0.5 x10 3/mm (ref 0.2–1.0)
Neutrophil #: 7.1 10*3/uL — ABNORMAL HIGH (ref 1.4–6.5)
Neutrophil %: 72.5 %
RBC: 4.83 10*6/uL (ref 4.40–5.90)
RDW: 14.3 % (ref 11.5–14.5)

## 2013-03-21 LAB — COMPREHENSIVE METABOLIC PANEL
Albumin: 4 g/dL (ref 3.4–5.0)
Alkaline Phosphatase: 115 U/L (ref 50–136)
Bilirubin,Total: 0.6 mg/dL (ref 0.2–1.0)
Calcium, Total: 8.9 mg/dL (ref 8.5–10.1)
Chloride: 106 mmol/L (ref 98–107)
Co2: 26 mmol/L (ref 21–32)
Creatinine: 1.01 mg/dL (ref 0.60–1.30)
EGFR (African American): >60
SGOT(AST): 30 U/L (ref 15–37)
SGPT (ALT): 28 U/L (ref 12–78)
Sodium: 137 mmol/L (ref 136–145)
Total Protein: 7.5 g/dL (ref 6.4–8.2)

## 2013-03-21 LAB — URINALYSIS, COMPLETE
Bacteria: NONE SEEN
Bilirubin,UR: NEGATIVE
Blood: NEGATIVE
Ketone: NEGATIVE
Leukocyte Esterase: NEGATIVE
Ph: 8 (ref 4.5–8.0)
RBC,UR: <1 /HPF (ref 0–5)
WBC UR: NONE SEEN /HPF (ref 0–5)

## 2013-03-21 LAB — LIPASE, BLOOD: Lipase: 124 U/L (ref 73–393)

## 2013-06-28 ENCOUNTER — Emergency Department: Payer: Self-pay | Admitting: Emergency Medicine

## 2013-06-28 LAB — URINALYSIS, COMPLETE
Bilirubin,UR: NEGATIVE
Blood: NEGATIVE
Ketone: NEGATIVE
Leukocyte Esterase: NEGATIVE
Ph: 7 (ref 4.5–8.0)
Specific Gravity: 1.003 (ref 1.003–1.030)
WBC UR: <1 /HPF (ref 0–5)

## 2013-06-28 LAB — COMPREHENSIVE METABOLIC PANEL
Albumin: 4.2 g/dL (ref 3.4–5.0)
BUN: 6 mg/dL — ABNORMAL LOW (ref 7–18)
Bilirubin,Total: 0.7 mg/dL (ref 0.2–1.0)
Calcium, Total: 8.8 mg/dL (ref 8.5–10.1)
Co2: 23 mmol/L (ref 21–32)
EGFR (Non-African Amer.): >60
Glucose: 105 mg/dL — ABNORMAL HIGH (ref 65–99)
Osmolality: 268 (ref 275–301)
Sodium: 135 mmol/L — ABNORMAL LOW (ref 136–145)
Total Protein: 7.6 g/dL (ref 6.4–8.2)

## 2013-06-28 LAB — CBC
HCT: 41.9 % (ref 40.0–52.0)
MCH: 29 pg (ref 26.0–34.0)
MCHC: 34.8 g/dL (ref 32.0–36.0)
MCV: 83 fL (ref 80–100)
RBC: 5.03 10*6/uL (ref 4.40–5.90)
WBC: 12.5 10*3/uL — ABNORMAL HIGH (ref 3.8–10.6)

## 2014-12-18 NOTE — H&P (Signed)
PATIENT NAME:  Mindi SlickerSNYDER, Jairus MR#:  [id number] DATE OF BIRTH:  09-15-1970  DATE OF ADMISSION:  09/29/2012  PRIMARY CARE PHYSICIAN: At [facility].   REFERRING PHYSICIAN: Irean HongJade J Sung, MD.   CHIEF COMPLAINT: Chest discomfort with left shoulder pain and neck pain.   HISTORY OF PRESENT ILLNESS: The patient is a 44 year old Caucasian male with a past medical history of coronary artery disease, status post CABG, cardiomyopathy with an ejection fraction of 35% to 40% as per echocardiogram in September 2012, hypertension, hyperlipidemia and HIV. He presented to the ER with a chief complaint of chest discomfort and left shoulder pain. The patient was in his usual state of health until 8:00 p.m. today and suddenly started experiencing left shoulder and neck pain with some discomfort in the chest. The patient took two baby aspirin and checked his blood pressure which was at 150/114. The patient is reporting that he usually his blood pressure is normal and 150/114 is unusual for him.  It was not associated with any diaphoresis, shortness of breath or nausea. The discomfort was throbbing in nature.  It was constant also. As the patient has history of artery disease in the past, status post CABG, he came into the ER. In the ER, the 12-lead EKG has revealed mild ST elevation and T-wave inversions in the lateral leads, which is chronic in nature, when compared to the old EKG from 2012.  There was no significant change in EKG.  Cardiac enzymes, first set are negative.  Blood work looks normal. The Hospitalist team was called to admit the patient to rule out acute coronary syndrome, given the significant history. The patient is reporting that after two more baby aspirin were given in the ER, chest pain was significantly improved. Subsequently, he had nitro paste attached to the anterior chest wall and the chest pain was completely resolved. [facility] was last year and the patient  was reporting that everything was normal at that time. He follows at [facility] regarding his HIV and his last CD4 count was 500 and HIV viral load was undetectable. The patient is reporting that he is compliant with his medications and takes them regularly. During my examination he denies any complaints of chest pain or shortness of breath and is resting comfortably.   PAST MEDICAL HISTORY: Coronary artery disease, status post CABG, cardiomyopathy with an ejection fraction of 35% to 40% with moderate LV systolic function and moderate diastolic function, as per echocardiogram in the year 2012.     PAST MEDICAL HISTORY:  Hypertension, hyperlipidemia, HIV.  He is on retroviral therapy.   PAST SURGICAL HISTORY: Coronary artery bypass grafting.   ALLERGIES 1.  AMPICILLIN. 2.  PENICILLIN.  PSYCHOSOCIAL HISTORY: Lives at home with his friend. Denies alcohol or illicit drug usage. The patient is disabled. He used to smoke, but quit smoking three years ago.   FAMILY HISTORY: He was adopted and his family history is unobtainable as he is adopted.   REVIEW OF SYSTEMS:  CONSTITUTIONAL: Patient denies any fever, fatigue, weakness, weight loss or weight gain.  EYES: Denies blurry vision, glaucoma, cataracts.  ENT: Denies tinnitus, ear pain, postnasal drip, sinus pain.  RESPIRATORY: Denies cough, chronic obstructive pulmonary disease, increasing hemoptysis.  CARDIOVASCULAR: Complaining of chest discomfort, but denies any palpitations, syncope, varicose veins.  GASTROINTESTINAL: Denies nausea, vomiting, diarrhea, GERD, jaundice, hematemesis.  GENITOURINARY: Denies dysuria, hematuria, renal calculi, denies polyuria or nocturia, thyroid problems.  HEMATOLOGIC/LYMPHATIC: Denies anemia, easy  bruising, bleeding.  INTEGUMENTARY: Denies acne, rash, lesions.  MUSCULOSKELETAL: Denies any pain in the shoulder, knee or hip. Complaining of neck pain on the left side.  NEUROLOGIC: Denies any vertigo, ataxia,  dementia, dysarthria, headache.  PSYCH: Past history of anxiety, but not on any medications right now. Denies any insomnia, ADD or OCD.   PHYSICAL EXAMINATION:  VITAL SIGNS: Pulse 74, respirations 20, blood pressure 126/87, sating 100% on 2 liters.   Temperature not recorded.  GENERAL APPEARANCE: Not in acute distress, moderately built and moderately nourished.  HEENT: Normocephalic, atraumatic. Pupils are equally reacting to light and accommodation. No conjunctival injection. Nares are patent no congestion. No sinus tenderness .  Oropharynx is intact.  NECK: Supple. No JVD. No thyromegaly.  LUNGS: Clear to auscultation bilaterally. Moderate air entry. No crackles. No wheezing. No accessory muscle usage. No anterior chest wall tenderness on palpation.  CARDIAC: S1, S2 normal. Regular rate and rhythm.  GI: Soft. Bowel sounds are positive in all four quadrants. Nontender, nondistended.  NEUROLOGIC: Awake, alert, and oriented x 3. Motor and sensory are grossly intact. Reflexes are 2+.  SKIN: No lesions. No rashes.  MUSCULOSKELETAL: No joint effusion, tenderness or erythema is noticed.  EXTREMITIES: No edema. No cyanosis. No clubbing.   LABORATORY DATA AND IMAGING STUDIES: CK total 130, CPK 1.0, troponin T less than 0.02. WBC 9.5, hemoglobin 14.4, hematocrit 41.3, platelet count 185. Chem-8 is within normal range. EKG, ST elevation is noted in lead III, which is chronic in nature and T-wave inversions in lateral leads which is also chronic in nature.   ASSESSMENT AND PLAN: A 44 year old male presenting to the ER with a chief complaint of some chest discomfort, as well as left shoulder pain and neck pain.  He will be admitted with the following assessment and plan.  1.  Atypical chest pain, with history of coronary artery disease, status post coronary artery bypass grafting and cardiomyopathy.  We will rule out acute coronary syndrome. Will admit him on observation status.       a.  Cycle cardiac  biomarkers.       b.  Cardiology consult is placed to [facility].       c.  Will implement ACS protocol.       d.  Therapeutic dose of Lovenox, first dose was given in the ER. Will continue that until acute coronary syndrome is ruled out.       e.  Will obtain 2-D echocardiogram.   2.  Coronary artery disease with cardiomyopathy with an ejection fraction of 35% to 40%.  Will continue home medication and get a 2-D echocardiogram.   3.  Human immunodeficiency virus.  Recent lab work has revealed a CD4 count of greater than 500 and undetectable viral load. We will continue his home medication, Complera.   4.  Hypertension. Will resume home medication and titrate medications on an as needed basis. Right now blood pressure is stable.  5.  Hyperlipidemia. Continue statin.  6.  Gastrointestinal and deep vein thrombosis prophylaxis will be provided with Protonix and Lovenox.  The diagnosis and plan of care was discussed with the patient.   Total Time Spent on the admission: 50 minutes.   ____________________________ Ramonita Lab, MD ag:eg D: 09/29/2012 01:49:31 ET T: 09/29/2012 18:37:23 ET JOB#: [id number]  cc: Ramonita Lab, MD, <Dictator> Ramonita Lab MD ELECTRONICALLY SIGNED 10/01/2012 23:50 Ramonita Lab MD ELECTRONICALLY SIGNED 10/01/2012 23:56

## 2014-12-18 NOTE — Discharge Summary (Signed)
PATIENT NAME:  Jared Williams, Jared Williams MR#:  [id number] DATE OF BIRTH:  05-08-1971  DATE OF ADMISSION:  09/29/2012 DATE OF DISCHARGE:  09/29/2012  CONSULTANT: Dr. Mariah MillingGollan from cardiology.   CHIEF COMPLAINT: Left shoulder and neck pain.   DISCHARGE DIAGNOSES:  1.  Atypical chest pain, possibly not cardiac.  2.  History of coronary artery disease, status post coronary artery bypass graft.  3.  Chronic systolic congestive heart failure.  4.  Hypertension.  5.  Hyperlipidemia.  6.  Human immunodeficiency virus.   DISCHARGE MEDICATIONS: Aspirin 81 mg daily, carvedilol 6.25 mg 2 times a day, Crestor 20 mg daily, lisinopril 2.5 mg daily, omplera oral tablet once a day and nitroglycerin sublingual 0.4 mg, 1 tab sublingual every 5 minutes as needed for chest pain.   DIET: Low sodium, low fat and low cholesterol.   ACTIVITY: As tolerated.   FOLLOWUP: Please follow with cardiology, Dr. Mariah MillingGollan within 1 to 2 weeks and arrange for outpatient stress test.   DISPOSITION: Home.   CODE STATUS: Full code.   HISTORY OF PRESENT ILLNESS/HOSPITAL COURSE: For full details of H and P, please see the dictation by Dr. Amado CoeGouru on 09/29/2012, but briefly this is a 44 year old gentleman with CAD, status post CABG, HIV hypertension, hyperlipidemia who presented with the above chief complaint. The patient was admitted to the hospitalist service on telemetry. The patient did not have significant chest pain, but describes his symptoms as more of left arm and neck pain, but stated that it was similar to previous episode of cardiac event and therefore was admitted. He underwent cyclic cardiac markers, which were all negative. The patient had no further recurrence of the pain. He was seen by cardiologist, Dr. Mariah MillingGollan. An echocardiogram was obtained, which did not show any significant changes from prior. Previous echo showed an EF of about 35% to 40%. Per cardiology, the patient is okay to be discharged and an outpatient stress test could  be done. The patient has vitals, which are stable and does have some stress at home. His outpatient HIV medications were continued. At this point, he will be discharged with outpatient followup. He had a basic metabolic panel and a CBC, which were pretty much normal.    ____________________________ Krystal EatonShayiq Czar Ysaguirre, MD sa:aw D: 09/29/2012 15:35:00 ET T: 09/30/2012 09:01:13 ET JOB#: [id number]  cc: Krystal EatonShayiq Dorwin Fitzhenry, MD, <Dictator> Antonieta Ibaimothy J. Gollan, MD Krystal EatonSHAYIQ Tannor Pyon MD ELECTRONICALLY SIGNED 10/03/2012 81:1920:08

## 2014-12-18 NOTE — Consult Note (Signed)
  General Aspect Jared Williams is a 44-year-old gentleman with coronary artery disease, non-ST elevation MI in January 2011, transferred from [facility] to [place] for two-vessel bypass surgery. He had a LIMA to the LAD, vein graft to the PDA. Also with apical aneurysm resection. History of postop atrial flutter, on amiodarone. History of HIV, bipolar disorder, schizophrenia, long history of tobacco abuse stopped in January 2011 (total of 20yrs of smoking).   Previously admitted in late October 2011 with diarrhea, chest pain. His symptoms were felt to be noncardiac.     episode of syncope,  found to be hypotensive. Carotid ultrasound showed no stenosis. No arrhythmia on telemetry. His Lisinopril and Aldactone were held and his coreg was cut in half to 12.5 mg b.i.d..     His event monitor did not show any significant arrhythmia. There was two short periods of sinus tachycardia.   last clinic follow up in 2011   EKG shows normal sinus rhythm with rate of 56 beats per minute, T-wave abnormality in V3, V4    Present Illness . PSYCHOSOCIAL HISTORY: Lives at home with his friend. Denies alcohol or illicit drug usage. The patient is disabled. He used to smoke, but quit smoking three years ago.   FAMILY HISTORY: He was adopted and his family history is unobtainable as he is adopted.    Prior echo 10/2009  - Left ventricle: The cavity size was mildly dilated. Systolic function was moderately reduced. The estimated ejection fraction was in the range of 35% to 40%. Hypokinesis of the anteroseptal myocardium. Hypokinesis of the anterior myocardium. Hypokinesis of the apical myocardium. Doppler parameters are consistent with abnormal left ventricular relaxation (grade 1 diastolic dysfunction).  - Pulmonary arteries: Systolic pressure was within the normal range.   Physical Exam:   GEN well developed, well nourished    HEENT red conjunctivae    RESP normal resp effort  clear BS     CARD Regular rate and rhythm  No murmur    ABD denies tenderness  soft    EXTR negative edema    SKIN normal to palpation    NEURO motor/sensory function intact    PSYCH alert, A+O to time, place, person, good insight   Review of Systems:   Subjective/Chief Complaint Chest pain, resolved last night in ER    General: No Complaints    Skin: No Complaints    ENT: No Complaints    Eyes: No Complaints    Neck: No Complaints    Respiratory: No Complaints    Cardiovascular: Chest pain or discomfort  resolved last night in ER    Gastrointestinal: No Complaints    Genitourinary: No Complaints    Vascular: No Complaints    Musculoskeletal: No Complaints    Neurologic: No Complaints    Hematologic: No Complaints    Endocrine: No Complaints    Psychiatric: No Complaints    Review of Systems: All other systems were reviewed and found to be negative    Medications/Allergies Reviewed Medications/Allergies reviewed     Hypertension:    MI - Myocardial Infarct:    HIV:    Other, see comments: BORDERLINE PERSONALITY   ADHD - Attention Deficit Disorder:    Other, see comments: AGORAPHOBIA   Panic Attacks:    Obsessive-Compulsive Disorder:    Depression:    Bipolar Disorder:    CABG (Coronary Artery Bypass Graft):    Inguinal Hernia Repair:        Admit Diagnosis:     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, Active, UNSTABLE ANGINA      Chronic Problem:   Asymptomatic human immunodeficiency virus (HIV) infection status: (V08) 29-Sep-2012, Active, ICD9, ASYMPTOMATIC HIV INFECTI  Home Medications: Medication Instructions Status  aspirin 81 mg oral delayed release tablet 2 tab(s) orally once a day Active  carvedilol 6.25 mg oral tablet 1 tab(s) orally 2 times a day Active  Crestor 20 mg oral tablet 1 tab(s) orally once a day (at bedtime) Active  lisinopril 2.5 mg oral tablet 1 tab(s) orally once a day Active  Complera oral tablet 1 tab(s) orally once a day  Active   Lab Results:  Routine Chem:  01-Feb-14 23:22    Glucose, Serum 93   BUN 11   Creatinine (comp) 0.98   Sodium, Serum 139   Potassium, Serum 3.6   Chloride, Serum 105   CO2, Serum 25   Calcium (Total), Serum 8.5   Anion Gap 9   Osmolality (calc) 277   eGFR (African American) >60   eGFR (Non-African American) >60 (eGFR values <60mL/min/1.73 m2 may be an indication of chronic kidney disease (CKD). Calculated eGFR is useful in patients with stable renal function. The eGFR calculation will not be reliable in acutely ill patients when serum creatinine is changing rapidly. It is not useful in  patients on dialysis. The eGFR calculation may not be applicable to patients at the low and high extremes of body sizes, pregnant women, and vegetarians.)  Cardiac:  01-Feb-14 23:22    Troponin I < 0.02 (0.00-0.05 0.05 ng/mL or less: NEGATIVE  Repeat testing in 3-6 hrs  if clinically indicated. >0.05 ng/mL: POTENTIAL  MYOCARDIAL INJURY. Repeat  testing in 3-6 hrs if  clinically indicated. NOTE: An increase or decrease  of 30% or more on serial  testing suggests a  clinically important change)   CK, Total 130   CPK-MB, Serum 1.0 (Result(s) reported on 29 Sep 2012 at 12:13AM.)  02-Feb-14 06:52    Troponin I < 0.02 (0.00-0.05 0.05 ng/mL or less: NEGATIVE  Repeat testing in 3-6 hrs  if clinically indicated. >0.05 ng/mL: POTENTIAL  MYOCARDIAL INJURY. Repeat  testing in 3-6 hrs if  clinically indicated. NOTE: An increase or decrease  of 30% or more on serial  testing suggests a  clinically important change)   CK, Total 106   CPK-MB, Serum 0.7 (Result(s) reported on 29 Sep 2012 at 07:44AM.)  Routine Hem:  01-Feb-14 23:22    WBC (CBC) 9.5   RBC (CBC) 4.91   Hemoglobin (CBC) 14.4   Hematocrit (CBC) 41.3   Platelet Count (CBC) 185 (Result(s) reported on 29 Sep 2012 at 12:03AM.)   MCV 84   MCH 29.3   MCHC 34.9   RDW 13.9   EKG:   Interpretation EKG shows NSR with rate  71 bpm, T wave abn anterolateral leads. Unchanged from 2011    PCN: Rash  Ampicillin: Rash  Vital Signs/Nurse's Notes: **Vital Signs.:   02-Feb-14 08:25   Vital Signs Type Routine   Temperature Temperature (F) 98.1   Celsius 36.7   Temperature Source oral   Pulse Pulse 81   Respirations Respirations 20   Systolic BP Systolic BP 110   Diastolic BP (mmHg) Diastolic BP (mmHg) 65   Mean BP 80   Pulse Ox % Pulse Ox % 94   Pulse Ox Activity Level  At rest   Oxygen Delivery Room Air/ 21 %     Impression Jared Williams is a 44-year-old gentleman with coronary artery disease,   non-ST elevation MI in January 2011, transferred from [facility] to [place] for two-vessel bypass surgery. He had a LIMA to the LAD, vein graft to the PDA. Also with apical aneurysm resection. History of postop atrial flutter, on amiodarone. History of HIV, bipolar disorder, schizophrenia, long history of tobacco abuse stopped in January 2011 (total of 20yrs of smoking).  1) Chest pain Known CAD, cabg doing well the past two years by his report (no clinic visits) no smoking since 2011, compliant on meds, exercising several times a week with no sx Chest pain at rest, atypical in location. "stress at home" Cardiac enz x 2 negative, no ekg changes from 2011 on lovenox echo pending. prior old anterior MI, ef 35 to 40% --If third cardiac enz negative, echo unchanged, would d/c home with outpt stress test We will call him to schedule --May need NTG sl script for home  2) CAD/CABG LIMA to LAD, SVG to PDA on crestor, b-blocker, aspirin  3) Smoking hx stopped in 08/2009  4) HIV+ managed at [facility]  5) Psych By his report, no active issues.   Electronic Signatures: Gollan, Timothy (MD)  (Signed 03-Feb-14 18:00)  Authored: General Aspect/Present Illness, History and Physical Exam, Review of System, Past Medical History, Health Issues, Home Medications, Labs, EKG , Allergies, Vital Signs/Nurse's  Notes, Impression/Plan   Last Updated: 03-Feb-14 18:00 by Gollan, Timothy (MD) 

## 2015-02-03 ENCOUNTER — Telehealth: Payer: Self-pay | Admitting: Family Medicine

## 2015-02-03 DIAGNOSIS — I25810 Atherosclerosis of coronary artery bypass graft(s) without angina pectoris: Secondary | ICD-10-CM

## 2015-02-03 DIAGNOSIS — I10 Essential (primary) hypertension: Secondary | ICD-10-CM

## 2015-02-03 MED ORDER — CARVEDILOL 6.25 MG PO TABS: 12.5000 mg | ORAL_TABLET | Freq: Two times a day (BID) | ORAL | Status: DC

## 2015-02-03 NOTE — Telephone Encounter (Signed)
Done

## 2015-03-29 ENCOUNTER — Other Ambulatory Visit: Payer: Self-pay | Admitting: Family Medicine

## 2015-03-29 DIAGNOSIS — I10 Essential (primary) hypertension: Secondary | ICD-10-CM

## 2015-03-29 DIAGNOSIS — E785 Hyperlipidemia, unspecified: Secondary | ICD-10-CM

## 2015-03-29 DIAGNOSIS — I252 Old myocardial infarction: Secondary | ICD-10-CM

## 2015-03-30 NOTE — Telephone Encounter (Signed)
Please remind Jared Williams that due to his type of insurance (Medicare) and his complex medical history he is expected to see me every 6 months as long as there are no problems. His last visit was 06/2014 which is 8months ago. He needs a annual medicare wellness visit and chronic medical issues follow up. I have sent in refills to his mail order for 3 months only to give him time to follow up.

## 2015-05-27 ENCOUNTER — Other Ambulatory Visit: Payer: Self-pay | Admitting: Family Medicine

## 2015-07-26 ENCOUNTER — Ambulatory Visit: Payer: Self-pay | Admitting: Family Medicine

## 2015-07-30 ENCOUNTER — Encounter: Payer: Self-pay | Admitting: Family Medicine

## 2015-07-30 ENCOUNTER — Other Ambulatory Visit: Payer: Self-pay | Admitting: Family Medicine

## 2015-07-30 ENCOUNTER — Ambulatory Visit (INDEPENDENT_AMBULATORY_CARE_PROVIDER_SITE_OTHER): Payer: Medicare PPO | Admitting: Family Medicine

## 2015-07-30 VITALS — BP 118/82 | HR 86 | Temp 99.2°F | Resp 16 | Wt 238.8 lb

## 2015-07-30 DIAGNOSIS — I252 Old myocardial infarction: Secondary | ICD-10-CM

## 2015-07-30 DIAGNOSIS — I25719 Atherosclerosis of autologous vein coronary artery bypass graft(s) with unspecified angina pectoris: Secondary | ICD-10-CM

## 2015-07-30 DIAGNOSIS — B20 Human immunodeficiency virus [HIV] disease: Secondary | ICD-10-CM

## 2015-07-30 DIAGNOSIS — I10 Essential (primary) hypertension: Secondary | ICD-10-CM | POA: Diagnosis not present

## 2015-07-30 DIAGNOSIS — Z21 Asymptomatic human immunodeficiency virus [HIV] infection status: Secondary | ICD-10-CM | POA: Diagnosis not present

## 2015-07-30 DIAGNOSIS — E785 Hyperlipidemia, unspecified: Secondary | ICD-10-CM | POA: Diagnosis not present

## 2015-07-30 DIAGNOSIS — I739 Peripheral vascular disease, unspecified: Secondary | ICD-10-CM | POA: Diagnosis not present

## 2015-07-30 MED ORDER — LISINOPRIL 2.5 MG PO TABS: 2.5000 mg | ORAL_TABLET | Freq: Every day | ORAL | Status: DC

## 2015-07-30 MED ORDER — CARVEDILOL 6.25 MG PO TABS: 6.2500 mg | ORAL_TABLET | Freq: Two times a day (BID) | ORAL | Status: DC

## 2015-07-30 MED ORDER — CRESTOR 20 MG PO TABS: 20.0000 mg | ORAL_TABLET | Freq: Every day | ORAL | Status: DC

## 2015-07-30 NOTE — Progress Notes (Signed)
Name: Jared Williams   MRN: [id number]    DOB: [birth year]STRO12/09/2015       Progress Note  Subjective  Chief Complaint  Chief Complaint  Patient presents with  . Medication Refill    patient needs a short amount to have fillled at his local pharmacy then send the 90 day supply to his mail order.    HPI  [name] is a 44 year old gentleman with coronary artery disease, non-ST elevation MI in January 2011 with two-vessel bypass surgery (LIMA to the LAD, vein graft to the PDA. Also with apical aneurysm resection). Otherwise history of postop atrial flutter, on amiodarone in the past but not currently. Ongoing stable HIV, bipolar disorder, schizophrenia, long history of tobacco abuse stopped in January 2011 (total of 16yrs of smoking).  Today he follows up for HTN, HLD refills after not follow up in over 1 year. Reports no acute symptoms or concerns. Tolerating all medications with no gaps.   Past Medical History  Diagnosis Date  . NSTEMI (non-ST elevated myocardial infarction) (HCC)     Acute  . LV dysfunction   . Coronary artery disease   . Left ventricular aneurysm     Apical  . Atrial flutter (HCC)     Postoperative  . History of human immunodeficiency virus infection     Currently under treatment by Dr. Zenaida Niece Der [facility]red Laser at [facility] on no antiretroviral therapy  . Hypertension   . Bipolar disorder (HCC)   . History of schizophrenia   . Tobacco abuse     Patient Active Problem List   Diagnosis Date Noted  . Angina pectoris (HCC) 12/14/2010  . HYPERLIPIDEMIA-MIXED 08/11/2010  . HIV INFECTION 04/19/2010  . INTERMITTENT CLAUDICATION, BILATERAL 04/19/2010  . Essential hypertension 10/20/2009  . CORONARY ATHEROSCLEROSIS OF ARTERY BYPASS GRAFT 10/20/2009  . Chronic ischemic heart disease 09/18/2009  . Myocardial infarction (HCC) 09/11/2009  . Psoriasis 05/31/2009  . Hypercholesterolemia 04/22/2009  . Human immunodeficiency virus (HIV) infection (HCC) 02/26/2000     Social History  Substance Use Topics  . Smoking status: Former Smoker -- 1.00 packs/day for 23 years  . Smokeless tobacco: Not on file  . Alcohol Use: No     Current outpatient prescriptions:  .  ascorbic acid (VITAMIN C) 500 MG tablet, Take 500 mg by mouth daily.  , Disp: , Rfl:  .  aspirin 81 MG EC tablet, Take 81 mg by mouth daily.  , Disp: , Rfl:  .  carvedilol (COREG) 6.25 MG tablet, TAKE 1 TABLET TWICE DAILY, Disp: 180 tablet, Rfl: 0 .  COMPLERA 200-25-300 MG TABS, Take 1 tablet by mouth daily., Disp: , Rfl:  .  CRESTOR 20 MG tablet, TAKE 1 TABLET EVERY DAY, Disp: 90 tablet, Rfl: 0 .  lisinopril (PRINIVIL,ZESTRIL) 2.5 MG tablet, TAKE 1 TABLET EVERY DAY, Disp: 90 tablet, Rfl: 0 .  NON FORMULARY, Study drug for HIV. , Disp: , Rfl:  .  Omega-3 Fatty Acids (FISH OIL) 1000 MG CAPS, Take 1,000 mg by mouth daily.  , Disp: , Rfl:  .  Vitamins-Lipotropics (B COMPLEX FORMULA 1) TABS, Take 1 tablet by mouth daily.  , Disp: , Rfl:   Past Surgical History  Procedure Laterality Date  . Coronary artery bypass graft    . Multiple tooth extractions      Dental, partially completed  . Hernia repair      Left    History reviewed. No pertinent family history.  Allergies  Allergen Reactions  .  Sulfa Antibiotics Anaphylaxis  . Ampicillin   . Penicillins      Review of Systems  CONSTITUTIONAL: No significant weight changes, fever, chills, weakness or fatigue.  HEENT:  - Eyes: No visual changes.  - Ears: No auditory changes. No pain.  - Nose: No sneezing, congestion, runny nose. - Throat: No sore throat. No changes in swallowing. SKIN: No rash or itching.  CARDIOVASCULAR: No chest pain, chest pressure or chest discomfort. No palpitations or edema.  RESPIRATORY: No shortness of breath, cough or sputum.  GASTROINTESTINAL: No anorexia, nausea, vomiting. No changes in bowel habits. No abdominal pain or blood.  GENITOURINARY: No dysuria. No frequency. No discharge. NEUROLOGICAL: No  headache, dizziness, syncope, paralysis, ataxia, numbness or tingling in the extremities. No memory changes. No change in bowel or bladder control.  MUSCULOSKELETAL: No joint pain. No muscle pain. HEMATOLOGIC: No anemia, bleeding or bruising.  LYMPHATICS: No enlarged lymph nodes.  PSYCHIATRIC: No change in mood. No change in sleep pattern.  ENDOCRINOLOGIC: No reports of sweating, cold or heat intolerance. No polyuria or polydipsia.     Objective  BP 118/82 mmHg  Pulse 86  Temp(Src) 99.2 F (37.3 C) (Oral)  Resp 16  Wt 238 lb 12.8 oz (108.319 kg)  SpO2 98% Body mass index is 32.38 kg/(m^2).  Physical Exam  Constitutional: Patient appears well-developed and well-nourished. In no distress. Tattered clothing otherwise reasonably well kept. HEENT:  - Head: Normocephalic and atraumatic. Dandruff.  - Ears: Bilateral TMs gray, no erythema or effusion. Scaly dry skin external canal. - Nose: Nasal mucosa moist - Mouth/Throat: Oropharynx is clear and moist. No tonsillar hypertrophy or erythema. No post nasal drainage.  - Eyes: Conjunctivae clear, EOM movements normal. PERRLA. No scleral icterus.  Neck: Normal range of motion. Neck supple. No JVD present. No thyromegaly present.  Cardiovascular: Normal rate, regular rhythm and normal heart sounds.  No murmur heard.  Pulmonary/Chest: Effort normal and breath sounds normal. No respiratory distress. Musculoskeletal: Normal range of motion bilateral UE and LE, no joint effusions. Peripheral vascular: Bilateral LE no edema. Neurological: CN II-XII grossly intact with no focal deficits. Alert and oriented to person, place, and time. Coordination, balance, strength, speech and gait are normal.  Skin: Skin is warm and dry. No rash noted. No erythema.  Psychiatric: Patient has a stable flat mood and affect. Behavior is normal in office today. Judgment and thought content normal in office today.  Assessment & Plan  1. Hypertension goal BP (blood  pressure) < 140/90 Stable. Due to recheck pertinent lab work. Continue current medication regimen.  - CBC with Differential/Platelet - Comprehensive metabolic panel - Lipid panel - carvedilol (COREG) 6.25 MG tablet; Take 1 tablet (6.25 mg total) by mouth 2 (two) times daily.  Dispense: 180 tablet; Refill: 2 - lisinopril (PRINIVIL,ZESTRIL) 2.5 MG tablet; Take 1 tablet (2.5 mg total) by mouth daily.  Dispense: 90 tablet; Refill: 2 - CRESTOR 20 MG tablet; Take 1 tablet (20 mg total) by mouth daily.  Dispense: 90 tablet; Refill: 2  2. Coronary artery disease 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 (HCC) Stable. Due to recheck pertinent lab work. Continue current medication regimen.  - CBC with Differential/Platelet - Comprehensive metabolic panel - Lipid panel - carvedilol (COREG) 6.25 MG tablet; Take 1 tablet (6.25 mg total) by mouth 2 (two) times daily.  Dispense: 180 tablet; Refill: 2 - lisinopril (PRINIVIL,ZESTRIL) 2.5 MG tablet; Take 1 tablet (2.5 mg total) by mouth daily.  Dispense: 90 tablet; Refill: 2 -  CRESTOR 20 MG tablet; Take 1 tablet (20 mg total) by mouth daily.  Dispense: 90 tablet; Refill: 2  3. Hyperlipidemia LDL goal <100 Continue statin.   - CBC with Differential/Platelet - Comprehensive metabolic panel - Lipid panel - carvedilol (COREG) 6.25 MG tablet; Take 1 tablet (6.25 mg total) by mouth 2 (two) times daily.  Dispense: 180 tablet; Refill: 2 - lisinopril (PRINIVIL,ZESTRIL) 2.5 MG tablet; Take 1 tablet (2.5 mg total) by mouth daily.  Dispense: 90 tablet; Refill: 2 - CRESTOR 20 MG tablet; Take 1 tablet (20 mg total) by mouth daily.  Dispense: 90 tablet; Refill: 2   4. Human immunodeficiency virus (HIV) infection (HCC) Continue Complera and follow up with specialist q6 months.   - CBC with Differential/Platelet - Comprehensive metabolic panel - Lipid panel  5. Peripheral arterial disease [facility]) Medical management optimized. Has quit  smoking.  - CBC with Differential/Platelet - Comprehensive metabolic panel - Lipid panel  6. History of MI (myocardial infarction) Medical management and prevention.  - CBC with Differential/Platelet - Comprehensive metabolic panel - Lipid panel - carvedilol (COREG) 6.25 MG tablet; Take 1 tablet (6.25 mg total) by mouth 2 (two) times daily.  Dispense: 180 tablet; Refill: 2 - lisinopril (PRINIVIL,ZESTRIL) 2.5 MG tablet; Take 1 tablet (2.5 mg total) by mouth daily.  Dispense: 90 tablet; Refill: 2 - CRESTOR 20 MG tablet; Take 1 tablet (20 mg total) by mouth daily.  Dispense: 90 tablet; Refill: 2

## 2015-07-31 LAB — CBC WITH DIFFERENTIAL/PLATELET
Basophils Absolute: 0.1 x10E3/uL (ref 0.0–0.2)
Basos: 1 %
EOS (ABSOLUTE): 0 x10E3/uL (ref 0.0–0.4)
Eos: 0 %
Hematocrit: 42.1 % (ref 37.5–51.0)
Hemoglobin: 14.7 g/dL (ref 12.6–17.7)
Immature Grans (Abs): 0 x10E3/uL (ref 0.0–0.1)
Immature Granulocytes: 0 %
Lymphocytes Absolute: 2.1 x10E3/uL (ref 0.7–3.1)
Lymphs: 30 %
MCH: 29.4 pg (ref 26.6–33.0)
MCHC: 34.9 g/dL (ref 31.5–35.7)
MCV: 84 fL (ref 79–97)
Monocytes Absolute: 0.4 x10E3/uL (ref 0.1–0.9)
Monocytes: 6 %
Neutrophils Absolute: 4.5 x10E3/uL (ref 1.4–7.0)
Neutrophils: 63 %
Platelets: 201 x10E3/uL (ref 150–379)
RBC: 5 x10E6/uL (ref 4.14–5.80)
RDW: 14.2 % (ref 12.3–15.4)
WBC: 7.1 x10E3/uL (ref 3.4–10.8)

## 2015-07-31 LAB — COMPREHENSIVE METABOLIC PANEL
A/G RATIO: 1.9 (ref 1.1–2.5)
ALBUMIN: 4.5 g/dL (ref 3.5–5.5)
ALT: 15 IU/L (ref 0–44)
AST: 20 IU/L (ref 0–40)
Alkaline Phosphatase: 96 IU/L (ref 39–117)
BILIRUBIN TOTAL: 0.5 mg/dL (ref 0.0–1.2)
BUN / CREAT RATIO: 10 (ref 9–20)
BUN: 10 mg/dL (ref 6–24)
CHLORIDE: 104 mmol/L (ref 97–106)
CO2: 23 mmol/L (ref 18–29)
Calcium: 9.3 mg/dL (ref 8.7–10.2)
Creatinine, Ser: 1.04 mg/dL (ref 0.76–1.27)
GFR calc non Af Amer: 86 mL/min/{1.73_m2} (ref >59)
GFR, EST AFRICAN AMERICAN: 100 mL/min/{1.73_m2} (ref >59)
Globulin, Total: 2.4 g/dL (ref 1.5–4.5)
Glucose: 82 mg/dL (ref 65–99)
POTASSIUM: 4.3 mmol/L (ref 3.5–5.2)
SODIUM: 141 mmol/L (ref 136–144)
TOTAL PROTEIN: 6.9 g/dL (ref 6.0–8.5)

## 2015-07-31 LAB — LIPID PANEL
Chol/HDL Ratio: 2.6 ratio (ref 0.0–5.0)
Cholesterol, Total: 84 mg/dL — ABNORMAL LOW (ref 100–199)
HDL: 32 mg/dL — ABNORMAL LOW (ref >39)
LDL Calculated: 37 mg/dL (ref 0–99)
Triglycerides: 76 mg/dL (ref 0–149)
VLDL Cholesterol Cal: 15 mg/dL (ref 5–40)

## 2016-01-17 ENCOUNTER — Telehealth: Payer: Self-pay | Admitting: Family Medicine

## 2016-01-17 MED ORDER — DM-GUAIFENESIN ER 30-600 MG PO TB12: 1.0000 | ORAL_TABLET | Freq: Two times a day (BID) | ORAL | Status: AC

## 2016-01-17 NOTE — Telephone Encounter (Signed)
Patient would like to know if he is able to take nyquil and any type of cough suppressant. He takes an Aspirin daily. Also Lisinopril, Complera, Carvedilol, and Crestor. Have been sick for 1 week with cough, runny nose, head cold, ear stopped up, sore throat

## 2016-01-17 NOTE — Telephone Encounter (Signed)
I spoke with patient; no fevers; mild sore throat, not severe, not strep sounding; reviewed meds, will have him try Mucinex DM; avoid decongestants; hope he feels better soon; he is already turning the corner and feeling a little better

## 2016-03-07 ENCOUNTER — Other Ambulatory Visit: Payer: Self-pay

## 2016-03-07 DIAGNOSIS — I10 Essential (primary) hypertension: Secondary | ICD-10-CM

## 2016-03-07 DIAGNOSIS — I252 Old myocardial infarction: Secondary | ICD-10-CM

## 2016-03-07 DIAGNOSIS — E785 Hyperlipidemia, unspecified: Secondary | ICD-10-CM

## 2016-03-07 DIAGNOSIS — I25719 Atherosclerosis of autologous vein coronary artery bypass graft(s) with unspecified angina pectoris: Secondary | ICD-10-CM

## 2016-03-07 MED ORDER — LISINOPRIL 2.5 MG PO TABS: 2.5000 mg | ORAL_TABLET | Freq: Every day | ORAL | Status: DC

## 2016-03-07 MED ORDER — CRESTOR 20 MG PO TABS: 20.0000 mg | ORAL_TABLET | Freq: Every day | ORAL | Status: DC

## 2016-03-07 MED ORDER — CARVEDILOL 6.25 MG PO TABS: 6.2500 mg | ORAL_TABLET | Freq: Two times a day (BID) | ORAL | Status: DC

## 2016-03-07 NOTE — Telephone Encounter (Signed)
Please ask patient to schedule an appt soon; last visit > 6 months ago; due for fasting labs; I'll send refills as requested until he can be seen

## 2016-03-08 ENCOUNTER — Other Ambulatory Visit: Payer: Self-pay

## 2016-03-08 DIAGNOSIS — I252 Old myocardial infarction: Secondary | ICD-10-CM

## 2016-03-08 DIAGNOSIS — I10 Essential (primary) hypertension: Secondary | ICD-10-CM

## 2016-03-08 DIAGNOSIS — I25719 Atherosclerosis of autologous vein coronary artery bypass graft(s) with unspecified angina pectoris: Secondary | ICD-10-CM

## 2016-03-08 DIAGNOSIS — E785 Hyperlipidemia, unspecified: Secondary | ICD-10-CM

## 2016-03-08 NOTE — Telephone Encounter (Signed)
appt made for 03/28/16

## 2016-03-08 NOTE — Telephone Encounter (Signed)
Pt ins requiring 90 day supply and needs to be resent to [facility]

## 2016-03-10 MED ORDER — CARVEDILOL 6.25 MG PO TABS: 6.2500 mg | ORAL_TABLET | Freq: Two times a day (BID) | ORAL | Status: DC

## 2016-03-10 MED ORDER — LISINOPRIL 2.5 MG PO TABS: 2.5000 mg | ORAL_TABLET | Freq: Every day | ORAL | Status: DC

## 2016-03-10 NOTE — Telephone Encounter (Signed)
I sent the carvedilol and ACE-I; his cholesterol dose may need to be adjusted; the 30 day supply of crestor will be sufficient for now until we see if his medicine needs changing; thank you

## 2016-03-28 ENCOUNTER — Ambulatory Visit: Payer: Medicare PPO | Admitting: Family Medicine

## 2016-04-26 ENCOUNTER — Telehealth: Payer: Self-pay | Admitting: Family Medicine

## 2016-04-26 DIAGNOSIS — I10 Essential (primary) hypertension: Secondary | ICD-10-CM

## 2016-04-26 DIAGNOSIS — I25719 Atherosclerosis of autologous vein coronary artery bypass graft(s) with unspecified angina pectoris: Secondary | ICD-10-CM

## 2016-04-26 DIAGNOSIS — E785 Hyperlipidemia, unspecified: Secondary | ICD-10-CM

## 2016-04-26 DIAGNOSIS — I252 Old myocardial infarction: Secondary | ICD-10-CM

## 2016-04-26 MED ORDER — LISINOPRIL 2.5 MG PO TABS: 2.5000 mg | ORAL_TABLET | Freq: Every day | ORAL | 0 refills | Status: DC

## 2016-04-26 MED ORDER — CARVEDILOL 6.25 MG PO TABS: 6.2500 mg | ORAL_TABLET | Freq: Two times a day (BID) | ORAL | 0 refills | Status: DC

## 2016-04-26 NOTE — Telephone Encounter (Signed)
Patient did not keep last appt; please ask him to reschedule I'll approve refills but really do hope to see him soon Thank you

## 2016-05-22 ENCOUNTER — Ambulatory Visit (INDEPENDENT_AMBULATORY_CARE_PROVIDER_SITE_OTHER): Payer: Medicare PPO | Admitting: Family Medicine

## 2016-05-22 ENCOUNTER — Encounter: Payer: Self-pay | Admitting: Family Medicine

## 2016-05-22 DIAGNOSIS — Z5181 Encounter for therapeutic drug level monitoring: Secondary | ICD-10-CM

## 2016-05-22 DIAGNOSIS — I252 Old myocardial infarction: Secondary | ICD-10-CM

## 2016-05-22 DIAGNOSIS — I25719 Atherosclerosis of autologous vein coronary artery bypass graft(s) with unspecified angina pectoris: Secondary | ICD-10-CM

## 2016-05-22 DIAGNOSIS — E785 Hyperlipidemia, unspecified: Secondary | ICD-10-CM

## 2016-05-22 DIAGNOSIS — Z21 Asymptomatic human immunodeficiency virus [HIV] infection status: Secondary | ICD-10-CM | POA: Diagnosis not present

## 2016-05-22 DIAGNOSIS — I739 Peripheral vascular disease, unspecified: Secondary | ICD-10-CM

## 2016-05-22 DIAGNOSIS — L409 Psoriasis, unspecified: Secondary | ICD-10-CM

## 2016-05-22 DIAGNOSIS — R635 Abnormal weight gain: Secondary | ICD-10-CM | POA: Insufficient documentation

## 2016-05-22 DIAGNOSIS — B20 Human immunodeficiency virus [HIV] disease: Secondary | ICD-10-CM

## 2016-05-22 DIAGNOSIS — I10 Essential (primary) hypertension: Secondary | ICD-10-CM | POA: Diagnosis not present

## 2016-05-22 LAB — CBC WITH DIFFERENTIAL/PLATELET
BASOS PCT: 0 %
Basophils Absolute: 0 cells/uL (ref 0–200)
EOS PCT: 0 %
Eosinophils Absolute: 0 cells/uL — ABNORMAL LOW (ref 15–500)
HCT: 42 % (ref 38.5–50.0)
Hemoglobin: 14.4 g/dL (ref 13.2–17.1)
LYMPHS PCT: 29 %
Lymphs Abs: 2958 cells/uL (ref 850–3900)
MCH: 28.6 pg (ref 27.0–33.0)
MCHC: 34.3 g/dL (ref 32.0–36.0)
MCV: 83.5 fL (ref 80.0–100.0)
MONOS PCT: 5 %
MPV: 9.4 fL (ref 7.5–12.5)
Monocytes Absolute: 510 cells/uL (ref 200–950)
NEUTROS ABS: 6732 {cells}/uL (ref 1500–7800)
Neutrophils Relative %: 66 %
PLATELETS: 201 10*3/uL (ref 140–400)
RBC: 5.03 MIL/uL (ref 4.20–5.80)
RDW: 14.3 % (ref 11.0–15.0)
WBC: 10.2 10*3/uL (ref 3.8–10.8)

## 2016-05-22 LAB — COMPLETE METABOLIC PANEL WITH GFR
ALT: 20 U/L (ref 9–46)
AST: 24 U/L (ref 10–40)
Albumin: 4.4 g/dL (ref 3.6–5.1)
Alkaline Phosphatase: 74 U/L (ref 40–115)
BILIRUBIN TOTAL: 0.6 mg/dL (ref 0.2–1.2)
BUN: 7 mg/dL (ref 7–25)
CO2: 26 mmol/L (ref 20–31)
CREATININE: 1.01 mg/dL (ref 0.60–1.35)
Calcium: 9.3 mg/dL (ref 8.6–10.3)
Chloride: 102 mmol/L (ref 98–110)
GFR, Est Non African American: 89 mL/min (ref >=60)
GLUCOSE: 78 mg/dL (ref 65–99)
Potassium: 4 mmol/L (ref 3.5–5.3)
SODIUM: 135 mmol/L (ref 135–146)
TOTAL PROTEIN: 7.2 g/dL (ref 6.1–8.1)

## 2016-05-22 LAB — LIPID PANEL
Cholesterol: 80 mg/dL — ABNORMAL LOW (ref 125–200)
HDL: 30 mg/dL — AB (ref >=40)
LDL Cholesterol: 33 mg/dL (ref <130)
Total CHOL/HDL Ratio: 2.7 Ratio (ref <=5.0)
Triglycerides: 86 mg/dL (ref <150)
VLDL: 17 mg/dL (ref <30)

## 2016-05-22 MED ORDER — CARVEDILOL 6.25 MG PO TABS: 6.2500 mg | ORAL_TABLET | Freq: Two times a day (BID) | ORAL | 3 refills | Status: DC

## 2016-05-22 MED ORDER — LISINOPRIL 2.5 MG PO TABS
2.5000 mg | ORAL_TABLET | Freq: Every day | ORAL | 3 refills | Status: DC
Start: 2016-05-22 — End: 2017-04-17

## 2016-05-22 MED ORDER — CRESTOR 20 MG PO TABS: 20.0000 mg | ORAL_TABLET | Freq: Every day | ORAL | 3 refills | Status: DC

## 2016-05-22 NOTE — Assessment & Plan Note (Addendum)
Check lipids; goal LDL less than 70 

## 2016-05-22 NOTE — Assessment & Plan Note (Signed)
Controlled today; avoid excess salt, try DASH guidelines

## 2016-05-22 NOTE — Patient Instructions (Addendum)
Try to limit saturated fats in your diet (bologna, hot dogs, barbeque, cheeseburgers, hamburgers, steak, bacon, sausage, cheese, etc.) and get more fresh fruits, vegetables, and whole grains  Try turmeric as a natural anti-inflammatory (for pain and arthritis). It comes in capsules where you buy aspirin and fish oil, but also as a spice where you buy pepper and garlic powder.  Build up activity gradually  Please do see your cardiologist  We'll get labs today If you have not heard anything from my staff in a week about any orders/referrals/studies from today, please contact us here to follow-up [phone]

## 2016-05-22 NOTE — Assessment & Plan Note (Signed)
Seeing ID

## 2016-05-22 NOTE — Assessment & Plan Note (Signed)
Uses meds

## 2016-05-22 NOTE — Assessment & Plan Note (Signed)
Goal LDL less than 70, on aspirin; will gradually increase activity

## 2016-05-22 NOTE — Assessment & Plan Note (Signed)
Check TSH 

## 2016-05-22 NOTE — Assessment & Plan Note (Signed)
No current angina; taking aspirin, check lipids, waffles with little bit of butter 6+ hours now

## 2016-05-22 NOTE — Progress Notes (Signed)
BP 122/70   Pulse 85   Temp 98.7 F (37.1 C) (Oral)   Resp 16   Wt 243 lb (110.2 kg)   SpO2 96%   BMI 32.96 kg/m    Subjective:    Patient ID: Jared Williams, male    DOB: 09/17/1970, 45 y.o.   MRN: [id number]  HPI: Jared Williams is a 45 y.o. male  Chief Complaint  Patient presents with  . Medication Refill   Patient is new to me today; he has hx of coronary artery disease; he had double bypass at age 61 Heart doctor is Dr. Mariah Milling, but he has not been seen him in a few years Has not smoked in years; quit when he had his heart attack Adopted and just doesn't know if heart attacks run in the family He tries to eat a good diet but there is room for improvement No chest pain Taking aspirin No swelling in the legs  BP excellent today; checks once in a blue moon; not checking often; not extra salt; tries to watch a good diet; has not been in a [employer] in years  Aches in the muscles; aches and pains; sleeps on the left side and now feeling inflammation in the left shoulder  Obesity; he thinks 220 pounds would be good weight for him  He has HIV and is followed by infectious disease specialist  Depression screen [facility] 2/9 05/22/2016 07/30/2015  Decreased Interest 0 0  Down, Depressed, Hopeless 0 0  PHQ - 2 Score 0 0   Relevant past medical, surgical, family and social history reviewed Past Medical History:  Diagnosis Date  . Atrial flutter (HCC)    Postoperative  . Bipolar disorder (HCC)   . Coronary artery disease   . History of human immunodeficiency virus infection    Currently under treatment by Dr. Zenaida Niece Der Hildred Laser at [facility] on no antiretroviral therapy  . History of schizophrenia   . Human immunodeficiency virus (HIV) infection (HCC) 02/26/2000  . Hypertension   . Left ventricular aneurysm    Apical  . LV dysfunction   . NSTEMI (non-ST elevated myocardial infarction) (HCC)    Acute  . Tobacco abuse    Past Surgical History:  Procedure Laterality Date  .  CORONARY ARTERY BYPASS GRAFT    . HERNIA REPAIR     Left  . MULTIPLE TOOTH EXTRACTIONS     Dental, partially completed   Family History  Problem Relation Age of Onset  . Adopted: Yes   Social History  Substance Use Topics  . Smoking status: Former Smoker    Packs/day: 1.00    Years: 23.00  . Smokeless tobacco: Never Used  . Alcohol use No   Interim medical history since last visit reviewed. Allergies and medications reviewed  Review of Systems Per HPI unless specifically indicated above     Objective:    BP 122/70   Pulse 85   Temp 98.7 F (37.1 C) (Oral)   Resp 16   Wt 243 lb (110.2 kg)   SpO2 96%   BMI 32.96 kg/m   Wt Readings from Last 3 Encounters:  05/22/16 243 lb (110.2 kg)  07/30/15 238 lb 12.8 oz (108.3 kg)  08/11/10 (!) 234 lb 8 oz (106.4 kg)    Physical Exam  Constitutional: He appears well-developed and well-nourished. No distress.  HENT:  Head: Normocephalic and atraumatic.  Eyes: EOM are normal. No scleral icterus.  Neck: No thyromegaly present.  Cardiovascular: Normal rate and  regular rhythm.   Pulmonary/Chest: Effort normal and breath sounds normal.  Abdominal: Soft. Bowel sounds are normal. He exhibits no distension.  Musculoskeletal: He exhibits no edema.  Neurological: Coordination normal.  Skin: Skin is warm and dry. No pallor.  Psychiatric: He has a normal mood and affect. His behavior is normal. Judgment and thought content normal.      Assessment & Plan:   Problem List Items Addressed This Visit      Cardiovascular and Mediastinum   Peripheral arterial disease (HCC)    Goal LDL less than 70, on aspirin; will gradually increase activity      Relevant Medications   carvedilol (COREG) 6.25 MG tablet   lisinopril (PRINIVIL,ZESTRIL) 2.5 MG tablet   Hypertension goal BP (blood pressure) < 140/90    Controlled today; avoid excess salt, try DASH guidelines      Relevant Medications   carvedilol (COREG) 6.25 MG tablet   lisinopril  (PRINIVIL,ZESTRIL) 2.5 MG tablet   Coronary artery disease 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 (HCC)    No current angina; taking aspirin, check lipids, waffles with little bit of butter 6+ hours now      Relevant Medications   carvedilol (COREG) 6.25 MG tablet   lisinopril (PRINIVIL,ZESTRIL) 2.5 MG tablet     Musculoskeletal and Integument   Psoriasis    Uses meds        Other   Hyperlipidemia LDL goal <100    Check lipids; goal LDL less than 70      Relevant Medications   carvedilol (COREG) 6.25 MG tablet   lisinopril (PRINIVIL,ZESTRIL) 2.5 MG tablet   Other Relevant Orders   Lipid panel (Completed)   Human immunodeficiency virus (HIV) infection (HCC)    Seeing ID      Encounter for medication monitoring   Relevant Orders   CBC with Differential/Platelet (Completed)   COMPLETE METABOLIC PANEL WITH GFR (Completed)   Abnormal weight gain    Check TSH      Relevant Orders   TSH (Completed)    Other Visit Diagnoses    History of MI (myocardial infarction)       Relevant Medications   carvedilol (COREG) 6.25 MG tablet   lisinopril (PRINIVIL,ZESTRIL) 2.5 MG tablet      Follow up plan: Return in about 6 months (around 11/19/2016) for fasting labs and visit, Dr. Sherie DonLada.  An after-visit summary was printed and given to the patient at check-out.  Please see the patient instructions which may contain other information and recommendations beyond what is mentioned above in the assessment and plan.  Meds ordered this encounter  Medications  . clobetasol ointment (TEMOVATE) 0.05 %  . carvedilol (COREG) 6.25 MG tablet    Sig: Take 1 tablet (6.25 mg total) by mouth 2 (two) times daily.    Dispense:  180 tablet    Refill:  3  . DISCONTD: CRESTOR 20 MG tablet    Sig: Take 1 tablet (20 mg total) by mouth at bedtime.    Dispense:  30 tablet    Refill:  3  . lisinopril (PRINIVIL,ZESTRIL) 2.5 MG tablet    Sig: Take 1 tablet (2.5 mg total) by  mouth daily.    Dispense:  90 tablet    Refill:  3    Orders Placed This Encounter  Procedures  . CBC with Differential/Platelet  . Lipid panel  . TSH  . COMPLETE METABOLIC PANEL WITH GFR

## 2016-05-23 ENCOUNTER — Other Ambulatory Visit: Payer: Self-pay | Admitting: Family Medicine

## 2016-05-23 DIAGNOSIS — E785 Hyperlipidemia, unspecified: Secondary | ICD-10-CM

## 2016-05-23 DIAGNOSIS — I10 Essential (primary) hypertension: Secondary | ICD-10-CM

## 2016-05-23 DIAGNOSIS — I25719 Atherosclerosis of autologous vein coronary artery bypass graft(s) with unspecified angina pectoris: Secondary | ICD-10-CM

## 2016-05-23 DIAGNOSIS — I252 Old myocardial infarction: Secondary | ICD-10-CM

## 2016-05-23 LAB — TSH: TSH: 2.47 m[IU]/L (ref 0.40–4.50)

## 2016-05-23 NOTE — Progress Notes (Signed)
Decrease statin slightly; recheck lipids fasting in 2 months

## 2016-05-25 ENCOUNTER — Encounter: Payer: Self-pay | Admitting: Family Medicine

## 2016-07-24 ENCOUNTER — Other Ambulatory Visit: Payer: Self-pay | Admitting: Family Medicine

## 2016-07-24 DIAGNOSIS — I252 Old myocardial infarction: Secondary | ICD-10-CM

## 2016-07-24 DIAGNOSIS — I10 Essential (primary) hypertension: Secondary | ICD-10-CM

## 2016-07-24 DIAGNOSIS — I25719 Atherosclerosis of autologous vein coronary artery bypass graft(s) with unspecified angina pectoris: Secondary | ICD-10-CM

## 2016-07-24 DIAGNOSIS — E785 Hyperlipidemia, unspecified: Secondary | ICD-10-CM

## 2016-07-26 NOTE — Telephone Encounter (Signed)
Pt.notified

## 2016-07-26 NOTE — Telephone Encounter (Signed)
Please see previous lab results We had hoped to recheck patient's lipids a couple of months after changing his instructions (1 pill alternating with 0.5 pills every other night) Please enter fasting lipids only (don't need sgpt) and ask him to stop by for labs soon I sent the Rx Thank you

## 2016-10-16 ENCOUNTER — Telehealth: Payer: Self-pay | Admitting: Family Medicine

## 2016-10-16 DIAGNOSIS — Z5181 Encounter for therapeutic drug level monitoring: Secondary | ICD-10-CM

## 2016-10-16 DIAGNOSIS — E785 Hyperlipidemia, unspecified: Secondary | ICD-10-CM

## 2016-10-16 DIAGNOSIS — N2889 Other specified disorders of kidney and ureter: Secondary | ICD-10-CM

## 2016-10-16 DIAGNOSIS — I25719 Atherosclerosis of autologous vein coronary artery bypass graft(s) with unspecified angina pectoris: Secondary | ICD-10-CM

## 2016-10-16 NOTE — Telephone Encounter (Signed)
[facility] I called to speak with patient about the abnormal imaging from years ago; left message asking him to call me back at his earliest convenience ------------------------ Will need to find out if this was addressed, if not, get urine, CT scan, refer to urologist ASAP

## 2016-10-17 ENCOUNTER — Encounter: Payer: Self-pay | Admitting: Family Medicine

## 2016-10-17 DIAGNOSIS — N2889 Other specified disorders of kidney and ureter: Secondary | ICD-10-CM

## 2016-10-17 HISTORY — DX: Other specified disorders of kidney and ureter: N28.89

## 2016-10-17 NOTE — Assessment & Plan Note (Signed)
Discussed w/patient; he knew about this but apparently it was not followed up on years ago; will get urine, labs, CT scan now, f/u in office to go over results

## 2016-10-17 NOTE — Telephone Encounter (Signed)
I called patient about the abnormal test results that came across my desk He says he knew about it but just never followed up No blood with urination; no abdominal or flank pain; he has had back problems, middle around shoulder blade Order urine, blood, and CT scan to be done on one day, then f/u to see me to go over results 2-3 days later He agrees ---------------------------------------- Debbe OdeaLatisha, I've put in orders for a CT scan He needs to arrange for transportation, so he doesn't think he can do this any sooner than Monday Please schedule CT scan, and then have him schedule appt to see me 2-3 days later to go over results (again, he'll need to arrange for transportation) Thank you

## 2016-10-18 ENCOUNTER — Telehealth: Payer: Self-pay

## 2016-10-18 NOTE — Assessment & Plan Note (Signed)
Monitor lipids 

## 2016-10-18 NOTE — Telephone Encounter (Signed)
Patient was informed and said thank you so much.

## 2016-10-18 NOTE — Telephone Encounter (Signed)
I contacted this patient to inform him that he has been scheduled to have his CT on Wednesday, October 25, 2016 at 1:30pm at the [facility]. He was informed that he must pick up a prep kit. He was told that he can get it when he comes for labs but that this must be done prior to his imaging.    He then asked if he could have his cholesterol checked at the same time since he did not do it the last time. I told him that I would let Dr. Lada know that he is requesting it. 

## 2016-10-18 NOTE — Assessment & Plan Note (Signed)
Check sgpt 

## 2016-10-18 NOTE — Telephone Encounter (Signed)
Thank you. I ordered lipids and SGPT to monitor his Crestor. These can all be done together. Please ask him to go fasting.

## 2016-10-18 NOTE — Telephone Encounter (Signed)
I contacted this patient to inform him that he has been scheduled to have his CT on Wednesday, October 25, 2016 at 1:30pm at the [facility]. He was informed that he must pick up a prep kit. He was told that he can get it when he comes for labs but that this must be done prior to his imaging.    He then asked if he could have his cholesterol checked at the same time since he did not do it the last time. I told him that I would let Dr. Sanda Klein know that he is requesting it.

## 2016-10-19 ENCOUNTER — Encounter: Payer: Self-pay | Admitting: Family Medicine

## 2016-10-23 ENCOUNTER — Other Ambulatory Visit: Payer: Self-pay

## 2016-10-23 ENCOUNTER — Telehealth: Payer: Self-pay

## 2016-10-23 DIAGNOSIS — N2889 Other specified disorders of kidney and ureter: Secondary | ICD-10-CM

## 2016-10-23 DIAGNOSIS — Z5181 Encounter for therapeutic drug level monitoring: Secondary | ICD-10-CM

## 2016-10-23 DIAGNOSIS — E785 Hyperlipidemia, unspecified: Secondary | ICD-10-CM

## 2016-10-23 DIAGNOSIS — I25719 Atherosclerosis of autologous vein coronary artery bypass graft(s) with unspecified angina pectoris: Secondary | ICD-10-CM

## 2016-10-23 LAB — LIPID PANEL
CHOLESTEROL: 98 mg/dL (ref <200)
HDL: 33 mg/dL — ABNORMAL LOW (ref >40)
LDL CALC: 48 mg/dL (ref <100)
TRIGLYCERIDES: 87 mg/dL (ref <150)
Total CHOL/HDL Ratio: 3 Ratio (ref <5.0)
VLDL: 17 mg/dL (ref <30)

## 2016-10-23 LAB — URINALYSIS, ROUTINE W REFLEX MICROSCOPIC
Bilirubin Urine: NEGATIVE
Glucose, UA: NEGATIVE
Hgb urine dipstick: NEGATIVE
Ketones, ur: NEGATIVE
LEUKOCYTES UA: NEGATIVE
Nitrite: NEGATIVE
PROTEIN: NEGATIVE
Specific Gravity, Urine: 1.003 (ref 1.001–1.035)
pH: 7 (ref 5.0–8.0)

## 2016-10-23 LAB — ALT: ALT: 13 U/L (ref 9–46)

## 2016-10-23 NOTE — Telephone Encounter (Signed)
Stephane, Ct tech from [facility] called and stated in order to get the renal mass scan  that you need for this pt, she would have to change it to renal abdomen W/WO contrast. She stated this will allow you and her to see any cancer and or cyst. Its changed and will need you to cosign. She stated if you have any questions or concerns you can call her @ [phone]. This is her private line.

## 2016-10-24 LAB — BASIC METABOLIC PANEL WITH GFR
BUN: 9 mg/dL (ref 7–25)
CO2: 28 mmol/L (ref 20–31)
Calcium: 9.2 mg/dL (ref 8.6–10.3)
Chloride: 103 mmol/L (ref 98–110)
Creat: 0.96 mg/dL (ref 0.60–1.35)
Glucose, Bld: 75 mg/dL (ref 65–99)
POTASSIUM: 4.1 mmol/L (ref 3.5–5.3)
Sodium: 137 mmol/L (ref 135–146)

## 2016-10-25 ENCOUNTER — Ambulatory Visit: Admission: RE | Admit: 2016-10-25 | Payer: Medicare PPO | Source: Ambulatory Visit

## 2016-11-16 ENCOUNTER — Telehealth: Payer: Self-pay | Admitting: Family Medicine

## 2016-11-16 NOTE — Telephone Encounter (Signed)
Please follow-up on the CT scan that I ordered in February It appears that the patient canceled it I really urge him to have this done Thank you

## 2016-11-17 NOTE — Telephone Encounter (Signed)
Patient stated that he could not afford the CT scan and will reschedule when he is able to pay for it.   He also stated that he didn't need his appt for Monday since it stated it was for fasting labs and he recently had them drawn.

## 2016-11-20 ENCOUNTER — Ambulatory Visit: Payer: Medicare PPO | Admitting: Family Medicine

## 2016-11-21 NOTE — Telephone Encounter (Signed)
Please let patient know that I am very concerned about this mass on his kidney and strongly recommend the CT scan; if he won't get the scan, let us at least refer him to urologist This could be cancer, so we don't want him to ignore it

## 2016-11-22 NOTE — Telephone Encounter (Signed)
Patient notified and I urged him to do CT or be referred to urologist, he states he will get back with us?

## 2016-11-22 NOTE — Telephone Encounter (Signed)
Thank you for trying.

## 2016-12-07 ENCOUNTER — Emergency Department
Admission: EM | Admit: 2016-12-07 | Discharge: 2016-12-07 | Disposition: A | Discharge status: Home / Self Care | Payer: Medicare PPO | Attending: Emergency Medicine | Admitting: Emergency Medicine

## 2016-12-07 DIAGNOSIS — Z87891 Personal history of nicotine dependence: Secondary | ICD-10-CM | POA: Insufficient documentation

## 2016-12-07 DIAGNOSIS — Z7982 Long term (current) use of aspirin: Secondary | ICD-10-CM | POA: Diagnosis not present

## 2016-12-07 DIAGNOSIS — R202 Paresthesia of skin: Secondary | ICD-10-CM | POA: Diagnosis not present

## 2016-12-07 DIAGNOSIS — Z79899 Other long term (current) drug therapy: Secondary | ICD-10-CM | POA: Insufficient documentation

## 2016-12-07 DIAGNOSIS — I2510 Atherosclerotic heart disease of native coronary artery without angina pectoris: Secondary | ICD-10-CM | POA: Diagnosis not present

## 2016-12-07 DIAGNOSIS — I10 Essential (primary) hypertension: Secondary | ICD-10-CM | POA: Diagnosis not present

## 2016-12-07 DIAGNOSIS — Z21 Asymptomatic human immunodeficiency virus [HIV] infection status: Secondary | ICD-10-CM | POA: Insufficient documentation

## 2016-12-07 DIAGNOSIS — R200 Anesthesia of skin: Secondary | ICD-10-CM | POA: Diagnosis present

## 2016-12-07 LAB — CBC
HEMATOCRIT: 42.2 % (ref 40.0–52.0)
HEMOGLOBIN: 14.5 g/dL (ref 13.0–18.0)
MCH: 28.7 pg (ref 26.0–34.0)
MCHC: 34.3 g/dL (ref 32.0–36.0)
MCV: 83.6 fL (ref 80.0–100.0)
Platelets: 175 10*3/uL (ref 150–440)
RBC: 5.05 MIL/uL (ref 4.40–5.90)
RDW: 14.8 % — ABNORMAL HIGH (ref 11.5–14.5)
WBC: 8.3 10*3/uL (ref 3.8–10.6)

## 2016-12-07 LAB — BASIC METABOLIC PANEL
ANION GAP: 6 (ref 5–15)
BUN: 11 mg/dL (ref 6–20)
CHLORIDE: 106 mmol/L (ref 101–111)
CO2: 25 mmol/L (ref 22–32)
Calcium: 8.9 mg/dL (ref 8.9–10.3)
Creatinine, Ser: 1.02 mg/dL (ref 0.61–1.24)
GFR calc Af Amer: >60 mL/min (ref >60)
GLUCOSE: 102 mg/dL — AB (ref 65–99)
POTASSIUM: 3.6 mmol/L (ref 3.5–5.1)
SODIUM: 137 mmol/L (ref 135–145)

## 2016-12-07 LAB — TROPONIN I: Troponin I: <0.03 ng/mL (ref <0.03)

## 2016-12-07 NOTE — Discharge Instructions (Signed)
As we discussed, your workup today was reassuring.  Though we do not know exactly what is causing your symptoms, it appears that you have no emergent medical condition at this time and that you are safe to go home and follow up as recommended in this paperwork. ° °Please return immediately to the Emergency Department if you develop any new or worsening symptoms that concern you. ° °

## 2016-12-07 NOTE — ED Notes (Signed)
Pt given and took with him all pill bottles on desk.

## 2016-12-07 NOTE — ED Triage Notes (Signed)
Per EMS, pt reports waking up with left sided facial numbness that radiates to shoulder and back. EMS reports pt had a bypass in 2011. Pt reporting now the numbness and tingling has decreased and is no longer present. Pt denies fevers and is A&O at this time. Pt reports taking 1 baby aspirin prior to EMS picking him up.

## 2016-12-07 NOTE — ED Provider Notes (Signed)
[facility] Emergency Department Provider Note  ____________________________________________   First MD Initiated Contact with Patient 12/07/16 0532     (approximate)  I have reviewed the triage vital signs and the nursing notes.   HISTORY  Chief Complaint Numbness (left side)    HPI Jared Williams is a 46 y.o. male with an extensive medical history as listed below who presents by EMS for evaluation of left-sided numbness.  He initially told EMS that it was in his left face.  He describes to me that he was having some discomfort in his left neck and radiating down to his left hand.  He was scared because of a history of cardiac disease although that has not been an issue for him for several years.  However the symptoms of completely resolved and he thinks that the numbness is actually the result of lying on his left arm and face.  He noticed the symptoms when he immediately woke up from sleep and he took an aspirin and called EMS, but the symptoms resolved on the way to the emergency department.  He is completely at his baseline at this time.  He reports that after gaining weight he has been having this kind of thing happen to him when he sleeps in certain positions.  He denies rest pain, shortness of breath, abdominal pain, nausea, vomiting, fever/chills.He states that he thinks he has arthritis which causes some chronic pain in his neck and back but nothing is different or worse than usual.   Past Medical History:  Diagnosis Date  . Atrial flutter (HCC)    Postoperative  . Bipolar disorder (HCC)   . Coronary artery disease   . History of human immunodeficiency virus infection    Currently under treatment by Dr. Zenaida Niece Der Hildred Laser at [facility] on no antiretroviral therapy  . History of schizophrenia   . Human immunodeficiency virus (HIV) infection (HCC) 02/26/2000  . Hypertension   . Left ventricular aneurysm    Apical  . LV dysfunction   . NSTEMI (non-ST elevated  myocardial infarction) (HCC)    Acute  . Right renal mass 10/17/2016   July 2014 CT scan  . Tobacco abuse     Patient Active Problem List   Diagnosis Date Noted  . Right renal mass 10/17/2016  . Abnormal weight gain 05/22/2016  . Encounter for medication monitoring 05/22/2016  . Peripheral arterial disease (HCC) 04/19/2010  . Hypertension goal BP (blood pressure) < 140/90 10/20/2009  . 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 Chronic ischemic heart disease 09/18/2009  . History of myocardial infarction 09/11/2009  . Psoriasis 05/31/2009  . Hyperlipidemia LDL goal <100 04/22/2009  . Human immunodeficiency virus (HIV) infection (HCC) 02/26/2000    Past Surgical History:  Procedure Laterality Date  . CORONARY ARTERY BYPASS GRAFT    . HERNIA REPAIR     Left  . MULTIPLE TOOTH EXTRACTIONS     Dental, partially completed    Prior to Admission medications   Medication Sig Start Date End Date Taking? Authorizing Provider  aspirin 81 MG EC tablet Take 81 mg by mouth daily.      Historical Provider, MD  carvedilol (COREG) 6.25 MG tablet Take 1 tablet (6.25 mg total) by mouth 2 (two) times daily. 05/22/16   Kerman Passey, MD  clobetasol ointment (TEMOVATE) 0.05 %  03/02/16   Historical Provider, MD  COMPLERA 200-25-300 MG TABS Take 1 tablet by  mouth daily. 06/30/15   Historical Provider, MD  CRESTOR 20 MG tablet Take one whole pill (20 mg) every other night, and one-half pill (10 mg) the other alternating nights 07/26/16   Kerman Passey, MD  lisinopril (PRINIVIL,ZESTRIL) 2.5 MG tablet Take 1 tablet (2.5 mg total) by mouth daily. 05/22/16   Kerman Passey, MD  NON FORMULARY Study drug for HIV.     Historical Provider, MD  Omega-3 Fatty Acids (FISH OIL) 1000 MG CAPS Take 1,000 mg by mouth daily.      Historical Provider, MD    Allergies Sulfa antibiotics; Ampicillin; and Penicillins  Family History  Problem Relation  Age of Onset  . Adopted: Yes    Social History Social History  Substance Use Topics  . Smoking status: Former Smoker    Packs/day: 1.00    Years: 23.00  . Smokeless tobacco: Never Used  . Alcohol use No    Review of Systems Constitutional: No fever/chills Eyes: No visual changes. ENT: No sore throat. Cardiovascular: Denies chest pain. Respiratory: Denies shortness of breath. Gastrointestinal: No abdominal pain.  No nausea, no vomiting.  No diarrhea.  No constipation. Genitourinary: Negative for dysuria. Musculoskeletal: Negative for back pain. Skin: Negative for rash. Neurological: Numbness and tingling in the left side of his neck and left arm which has completely resolved and was only noticeable when he first woke up from sleep  10-point ROS otherwise negative.  ____________________________________________   PHYSICAL EXAM:  VITAL SIGNS: ED Triage Vitals  Enc Vitals Group     BP 12/07/16 0448 128/89     Pulse Rate 12/07/16 0448 78     Resp 12/07/16 0448 15     Temp 12/07/16 0448 98.1 F (36.7 C)     Temp Source 12/07/16 0448 Oral     SpO2 12/07/16 0448 99 %     Weight 12/07/16 0449 235 lb (106.6 kg)     Height 12/07/16 0449  (1.88 m)     Head Circumference --      Peak Flow --      Pain Score --      Pain Loc --      Pain Edu? --      Excl. in GC? --     Constitutional: Alert and oriented. Well appearing and in no acute distress. Eyes: Conjunctivae are normal. PERRL. EOMI. Head: Atraumatic. Nose: No congestion/rhinnorhea. Mouth/Throat: Mucous membranes are moist. Neck: No stridor.  No meningeal signs.  No cervical spine tenderness to palpation. Cardiovascular: Normal rate, regular rhythm. Good peripheral circulation. Grossly normal heart sounds. Respiratory: Normal respiratory effort.  No retractions. Lungs CTAB. Gastrointestinal: Soft and nontender. No distention.  Musculoskeletal: No lower extremity tenderness nor edema. No gross deformities of  extremities. Neurologic:  Normal speech and language. No gross focal neurologic deficits are appreciated.  Skin:  Skin is warm, dry and intact. No rash noted. Psychiatric: Mood and affect are normal. Speech and behavior are normal.  ____________________________________________   LABS (all labs ordered are listed, but only abnormal results are displayed)  Labs Reviewed  BASIC METABOLIC PANEL - Abnormal; Notable for the following:       Result Value   Glucose, Bld 102 (*)    All other components within normal limits  CBC - Abnormal; Notable for the following:    RDW 14.8 (*)    All other components within normal limits  TROPONIN I   ____________________________________________  EKG  ED ECG REPORT I, Oney Folz, the attending  physician, personally viewed and interpreted this ECG.  Date: 12/07/2016 EKG Time: 5:00 Rate: 76 Rhythm: normal sinus rhythm QRS Axis: normal Intervals: normal ST/T Wave abnormalities: normal Conduction Disturbances: none Narrative Interpretation: unremarkable  ____________________________________________  RADIOLOGY   No results found.  ____________________________________________   PROCEDURES  Critical Care performed: No   Procedure(s) performed:   Procedures   ____________________________________________   INITIAL IMPRESSION / ASSESSMENT AND PLAN / ED COURSE  Pertinent labs & imaging results that were available during my care of the patient were reviewed by me and considered in my medical decision making (see chart for details).  The patient is completely asymptomatic this time.  There is nothing to suggest that he was having ACS; at no point did he have any chest pain or shortness of breath.  His EKG is unremarkable and his workup was reassuring.  PERC negative.  The patient states he wants to go home and I think that is appropriate since there is no evidence of acute or emergent medical condition at this time.  He will  follow-up with his regular doctor at the next available opportunity.  I gave my usual and customary return precautions.         ____________________________________________  FINAL CLINICAL IMPRESSION(S) / ED DIAGNOSES  Final diagnoses:  Paresthesia of left upper extremity     MEDICATIONS GIVEN DURING THIS VISIT:  Medications - No data to display   NEW OUTPATIENT MEDICATIONS STARTED DURING THIS VISIT:  New Prescriptions   No medications on file    Modified Medications   No medications on file    Discontinued Medications   No medications on file     Note:  This document was prepared using Dragon voice recognition software and may include unintentional dictation errors.    Loleta Rose, MD 12/07/16 [phone]

## 2017-04-17 ENCOUNTER — Other Ambulatory Visit: Payer: Self-pay | Admitting: Family Medicine

## 2017-04-17 DIAGNOSIS — I25719 Atherosclerosis of autologous vein coronary artery bypass graft(s) with unspecified angina pectoris: Secondary | ICD-10-CM

## 2017-04-17 DIAGNOSIS — E785 Hyperlipidemia, unspecified: Secondary | ICD-10-CM

## 2017-04-17 DIAGNOSIS — I10 Essential (primary) hypertension: Secondary | ICD-10-CM

## 2017-04-17 DIAGNOSIS — I252 Old myocardial infarction: Secondary | ICD-10-CM

## 2017-04-18 MED ORDER — CARVEDILOL 6.25 MG PO TABS: 6.2500 mg | ORAL_TABLET | Freq: Two times a day (BID) | ORAL | 0 refills | Status: DC

## 2017-04-18 MED ORDER — LISINOPRIL 2.5 MG PO TABS: 2.5000 mg | ORAL_TABLET | Freq: Every day | ORAL | 0 refills | Status: DC

## 2017-04-18 NOTE — Telephone Encounter (Signed)
Patient needs an appt and labs please I'll allow one month only to local pharmacy, but he needs to see a provider please and come in for follow-up; he was due to be seen in March

## 2017-04-18 NOTE — Telephone Encounter (Signed)
Pt is scheduled °

## 2017-05-11 ENCOUNTER — Other Ambulatory Visit: Payer: Self-pay | Admitting: Family Medicine

## 2017-05-11 DIAGNOSIS — I10 Essential (primary) hypertension: Secondary | ICD-10-CM

## 2017-05-11 DIAGNOSIS — E785 Hyperlipidemia, unspecified: Secondary | ICD-10-CM

## 2017-05-11 DIAGNOSIS — I252 Old myocardial infarction: Secondary | ICD-10-CM

## 2017-05-11 DIAGNOSIS — I25719 Atherosclerosis of autologous vein coronary artery bypass graft(s) with unspecified angina pectoris: Secondary | ICD-10-CM

## 2017-05-23 ENCOUNTER — Encounter: Payer: Self-pay | Admitting: Family Medicine

## 2017-05-23 ENCOUNTER — Telehealth: Payer: Self-pay

## 2017-05-23 ENCOUNTER — Ambulatory Visit (INDEPENDENT_AMBULATORY_CARE_PROVIDER_SITE_OTHER): Payer: Medicare PPO | Admitting: Family Medicine

## 2017-05-23 DIAGNOSIS — B20 Human immunodeficiency virus [HIV] disease: Secondary | ICD-10-CM | POA: Diagnosis not present

## 2017-05-23 DIAGNOSIS — E785 Hyperlipidemia, unspecified: Secondary | ICD-10-CM | POA: Diagnosis not present

## 2017-05-23 DIAGNOSIS — Z5181 Encounter for therapeutic drug level monitoring: Secondary | ICD-10-CM

## 2017-05-23 DIAGNOSIS — I25719 Atherosclerosis of autologous vein coronary artery bypass graft(s) with unspecified angina pectoris: Secondary | ICD-10-CM | POA: Diagnosis not present

## 2017-05-23 DIAGNOSIS — I10 Essential (primary) hypertension: Secondary | ICD-10-CM

## 2017-05-23 DIAGNOSIS — I252 Old myocardial infarction: Secondary | ICD-10-CM | POA: Diagnosis not present

## 2017-05-23 DIAGNOSIS — N2889 Other specified disorders of kidney and ureter: Secondary | ICD-10-CM

## 2017-05-23 DIAGNOSIS — R635 Abnormal weight gain: Secondary | ICD-10-CM | POA: Diagnosis not present

## 2017-05-23 MED ORDER — CRESTOR 20 MG PO TABS: 20.0000 mg | ORAL_TABLET | ORAL | 1 refills | Status: DC

## 2017-05-23 MED ORDER — LISINOPRIL 2.5 MG PO TABS: 2.5000 mg | ORAL_TABLET | Freq: Every day | ORAL | 1 refills | Status: DC

## 2017-05-23 MED ORDER — CRESTOR 20 MG PO TABS: 20.0000 mg | ORAL_TABLET | ORAL | 1 refills | Status: AC

## 2017-05-23 MED ORDER — CARVEDILOL 6.25 MG PO TABS: 6.2500 mg | ORAL_TABLET | Freq: Two times a day (BID) | ORAL | 1 refills | Status: DC

## 2017-05-23 NOTE — Telephone Encounter (Signed)
Pt was here at office visit. Pt Crestor was sent to [facility] but the refill was suppose to be sent to [facility] mail order. Cancel the Rx at [facility] and did a verbal at [facility] for the crestor 20 mg tab( take 1 20 mg tablet y mouth every other day)

## 2017-05-23 NOTE — Assessment & Plan Note (Signed)
Check thyroid   

## 2017-05-23 NOTE — Assessment & Plan Note (Addendum)
idscussed concern about the CT scan from 2014; explained could be cancer; he refused the CT scan but will consider [place]; need to treat if cancer present obviously; he says if he did have cancer, he would not want to treat it; he feels like he has already outlived the odds, after what he's been through; he'll consider the [place] but would not promise to get it

## 2017-05-23 NOTE — Assessment & Plan Note (Signed)
Check liver and kidneys 

## 2017-05-23 NOTE — Progress Notes (Signed)
BP 124/72   Pulse 74   Temp 98.3 F (36.8 C) (Oral)   Resp 14   Wt 243 lb 9.6 oz (110.5 kg)   SpO2 97%   BMI 31.28 kg/m    Subjective:    Patient ID: Jared Williams, male    DOB: 1971/04/15, 46 y.o.   MRN: [id number]  HPI: Jared Williams is a 46 y.o. male  Chief Complaint  Patient presents with  . Medication Refill    HPI High blood pressure; rarely checking BP away from the doctor Does not add extra salt   Adds sugar to coffee; occasional sweets, sugar on cereal; will try to limit Has put on some weight; might be what he's eating; will consider changing his diet Gets hot flashes occasionally, cuts fan on and off, up and down  High cholesterol; taking crestor one whole pill every other day instead Trying to limit fatty meats; just eating chicken and [place]; chicken on pizza  HIV; seeing ID; flu shot there  Not interested in tetanus booster today  CAD; not seeing cardiologist; no reason he says; no chest pain; taking aspirin  Right renal mass; patient never got the CT  Depression screen [facility] 2/9 05/23/2017 05/22/2016 07/30/2015  Decreased Interest 0 0 0  Down, Depressed, Hopeless 0 0 0  PHQ - 2 Score 0 0 0    Relevant past medical, surgical, family and social history reviewed Past Medical History:  Diagnosis Date  . Atrial flutter (HCC)    Postoperative  . Bipolar disorder (HCC)   . Coronary artery disease   . History of human immunodeficiency virus infection    Currently under treatment by Dr. Zenaida Niece Der Hildred Laser at [facility] on no antiretroviral therapy  . History of schizophrenia   . Human immunodeficiency virus (HIV) infection (HCC) 02/26/2000  . Hypertension   . Left ventricular aneurysm    Apical  . LV dysfunction   . NSTEMI (non-ST elevated myocardial infarction) (HCC)    Acute  . Right renal mass 10/17/2016   July 2014 CT scan  . Tobacco abuse    Past Surgical History:  Procedure Laterality Date  . CORONARY ARTERY BYPASS GRAFT    . HERNIA REPAIR     Left  . MULTIPLE TOOTH EXTRACTIONS     Dental, partially completed   Family History  Problem Relation Age of Onset  . Adopted: Yes   Social History   Social History  . Marital status: Single    Spouse name: N/A  . Number of children: 1  . Years of education: N/A   Occupational History  .  Disabled   Social History Main Topics  . Smoking status: Former Smoker    Packs/day: 1.00    Years: 23.00  . Smokeless tobacco: Never Used  . Alcohol use 0.0 oz/week     Comment: rare  . Drug use: No  . Sexual activity: No   Other Topics Concern  . Not on file   Social History Narrative   Lives alone in [place].   Has 1 daughter.   Current disability for psychiatric illness.    Interim medical history since last visit reviewed. Allergies and medications reviewed  Review of Systems Per HPI unless specifically indicated above     Objective:    BP 124/72   Pulse 74   Temp 98.3 F (36.8 C) (Oral)   Resp 14   Wt 243 lb 9.6 oz (110.5 kg)   SpO2 97%   BMI  31.28 kg/m   Wt Readings from Last 3 Encounters:  05/23/17 243 lb 9.6 oz (110.5 kg)  12/07/16 235 lb (106.6 kg)  05/22/16 243 lb (110.2 kg)    Physical Exam  Constitutional: He appears well-developed and well-nourished. No distress.  HENT:  Head: Normocephalic and atraumatic.  Eyes: EOM are normal. No scleral icterus.  Neck: No thyromegaly present.  Cardiovascular: Normal rate and regular rhythm.   Pulmonary/Chest: Effort normal and breath sounds normal.  Abdominal: Soft. Bowel sounds are normal. He exhibits no distension.  Musculoskeletal: He exhibits no edema.  Neurological: Coordination normal.  Skin: Skin is warm and dry. No pallor.  Psychiatric: He has a normal mood and affect. His behavior is normal. Judgment and thought content normal.   Results for orders placed or performed during the hospital encounter of 12/07/16  Basic metabolic panel  Result Value Ref Range   Sodium 137 135 - 145 mmol/L    Potassium 3.6 3.5 - 5.1 mmol/L   Chloride 106 101 - 111 mmol/L   CO2 25 22 - 32 mmol/L   Glucose, Bld 102 (H) 65 - 99 mg/dL   BUN 11 6 - 20 mg/dL   Creatinine, Ser 1.61 0.61 - 1.24 mg/dL   Calcium 8.9 8.9 - 09.6 mg/dL   GFR calc non Af Amer >60 >60 mL/min   GFR calc Af Amer >60 >60 mL/min   Anion gap 6 5 - 15  CBC  Result Value Ref Range   WBC 8.3 3.8 - 10.6 K/uL   RBC 5.05 4.40 - 5.90 MIL/uL   Hemoglobin 14.5 13.0 - 18.0 g/dL   HCT 04.5 40.9 - 81.1 %   MCV 83.6 80.0 - 100.0 fL   MCH 28.7 26.0 - 34.0 pg   MCHC 34.3 32.0 - 36.0 g/dL   RDW 91.4 (H) 78.2 - 95.6 %   Platelets 175 150 - 440 K/uL  Troponin I  Result Value Ref Range   Troponin I <0.03 <0.03 ng/mL      Assessment & Plan:   Problem List Items Addressed This Visit      Cardiovascular and Mediastinum   Hypertension goal BP (blood pressure) < 140/90    Well-controlled; continue meds, limit salts      Relevant Medications   carvedilol (COREG) 6.25 MG tablet   lisinopril (PRINIVIL,ZESTRIL) 2.5 MG tablet   CRESTOR 20 MG tablet   Coronary artery disease 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 (HCC)    Check lipids; continue aspirin and statin      Relevant Medications   carvedilol (COREG) 6.25 MG tablet   lisinopril (PRINIVIL,ZESTRIL) 2.5 MG tablet   CRESTOR 20 MG tablet     Other   Right renal mass    idscussed concern about the CT scan from 2014; explained could be cancer; he refused the CT scan but will consider [place]; need to treat if cancer present obviously; he says if he did have cancer, he would not want to treat it; he feels like he has already outlived the odds, after what he's been through; he'll consider the [place] but would not promise to get it      Relevant Orders   US Renal   Urinalysis w microscopic + reflex cultur   Hyperlipidemia LDL goal <100    Check lipids. Two hot dogs this morning, otherwise fasting, limiting red meat      Relevant Medications   carvedilol  (COREG) 6.25 MG tablet   lisinopril (PRINIVIL,ZESTRIL) 2.5  MG tablet   CRESTOR 20 MG tablet   Other Relevant Orders   Lipid panel   Human immunodeficiency virus (HIV) infection (HCC)    Follow-up with ID; flu shot at ID office      Encounter for medication monitoring    Check liver and kidneys      Relevant Orders   COMPLETE METABOLIC PANEL WITH GFR   Abnormal weight gain    Check thyroid      Relevant Orders   TSH    Other Visit Diagnoses    History of MI (myocardial infarction)       Relevant Medications   carvedilol (COREG) 6.25 MG tablet   lisinopril (PRINIVIL,ZESTRIL) 2.5 MG tablet   CRESTOR 20 MG tablet       Follow up plan: Return in about 6 months (around 11/20/2017) for twenty minute follow-up with fasting labs.  An after-visit summary was printed and given to the patient at check-out.  Please see the patient instructions which may contain other information and recommendations beyond what is mentioned above in the assessment and plan.  Meds ordered this encounter  Medications  . carvedilol (COREG) 6.25 MG tablet    Sig: Take 1 tablet (6.25 mg total) by mouth 2 (two) times daily.    Dispense:  180 tablet    Refill:  1  . lisinopril (PRINIVIL,ZESTRIL) 2.5 MG tablet    Sig: Take 1 tablet (2.5 mg total) by mouth daily.    Dispense:  90 tablet    Refill:  1  . DISCONTD: CRESTOR 20 MG tablet    Sig: Take 1 tablet (20 mg total) by mouth every other day. Take one whole pill (20 mg) every other night, and one-half pill (10 mg) the other alternating nights    Dispense:  451 tablet    Refill:  1  . CRESTOR 20 MG tablet    Sig: Take 1 tablet (20 mg total) by mouth every other day.    Dispense:  45 tablet    Refill:  1    Orders Placed This Encounter  Procedures  . US Renal  . COMPLETE METABOLIC PANEL WITH GFR  . Urinalysis w microscopic + reflex cultur  . TSH  . Lipid panel

## 2017-05-23 NOTE — Assessment & Plan Note (Signed)
Well-controlled; continue meds, limit salts

## 2017-05-23 NOTE — Assessment & Plan Note (Signed)
Check lipids. Two hot dogs this morning, otherwise fasting, limiting red meat

## 2017-05-23 NOTE — Patient Instructions (Signed)
I've ordered the ultrasound and encourage you to consider having it done We'll get labs today Return in 6 months Try to limit saturated fats in your diet (bologna, hot dogs, barbeque, cheeseburgers, hamburgers, steak, bacon, sausage, cheese, etc.) and get more fresh fruits, vegetables, and whole grains

## 2017-05-23 NOTE — Assessment & Plan Note (Signed)
Follow-up with ID; flu shot at ID office

## 2017-05-23 NOTE — Assessment & Plan Note (Signed)
Check lipids; continue aspirin and statin

## 2017-05-24 LAB — LIPID PANEL
CHOL/HDL RATIO: 2.8 (calc) (ref <5.0)
Cholesterol: 105 mg/dL (ref <200)
HDL: 37 mg/dL — ABNORMAL LOW (ref >40)
LDL CHOLESTEROL (CALC): 52 mg/dL
NON-HDL CHOLESTEROL (CALC): 68 mg/dL (ref <130)
TRIGLYCERIDES: 75 mg/dL (ref <150)

## 2017-05-24 LAB — COMPLETE METABOLIC PANEL WITH GFR
AG Ratio: 1.7 (calc) (ref 1.0–2.5)
ALBUMIN MSPROF: 4.5 g/dL (ref 3.6–5.1)
ALT: 19 U/L (ref 9–46)
AST: 22 U/L (ref 10–40)
Alkaline phosphatase (APISO): 97 U/L (ref 40–115)
BUN: 9 mg/dL (ref 7–25)
CO2: 25 mmol/L (ref 20–32)
CREATININE: 0.97 mg/dL (ref 0.60–1.35)
Calcium: 9.3 mg/dL (ref 8.6–10.3)
Chloride: 103 mmol/L (ref 98–110)
GFR, EST AFRICAN AMERICAN: 107 mL/min/{1.73_m2} (ref >=60)
GFR, EST NON AFRICAN AMERICAN: 93 mL/min/{1.73_m2} (ref >=60)
Globulin: 2.7 g/dL (calc) (ref 1.9–3.7)
Glucose, Bld: 77 mg/dL (ref 65–99)
Potassium: 4.1 mmol/L (ref 3.5–5.3)
Sodium: 138 mmol/L (ref 135–146)
TOTAL PROTEIN: 7.2 g/dL (ref 6.1–8.1)
Total Bilirubin: 0.6 mg/dL (ref 0.2–1.2)

## 2017-05-24 LAB — URINALYSIS W MICROSCOPIC + REFLEX CULTURE
BILIRUBIN URINE: NEGATIVE
Bacteria, UA: NONE SEEN /HPF
GLUCOSE, UA: NEGATIVE
HYALINE CAST: NONE SEEN /LPF
Hgb urine dipstick: NEGATIVE
Ketones, ur: NEGATIVE
Leukocyte Esterase: NEGATIVE
Nitrites, Initial: NEGATIVE
PROTEIN: NEGATIVE
RBC / HPF: NONE SEEN /HPF (ref 0–2)
SQUAMOUS EPITHELIAL / LPF: NONE SEEN /HPF (ref <=5)
Specific Gravity, Urine: 1.002 (ref 1.001–1.03)
WBC UA: NONE SEEN /HPF (ref 0–5)
pH: 6.5 (ref 5.0–8.0)

## 2017-05-24 LAB — NO CULTURE INDICATED

## 2017-05-24 LAB — TSH: TSH: 2.92 mIU/L (ref 0.40–4.50)

## 2017-06-05 ENCOUNTER — Telehealth: Payer: Self-pay

## 2017-06-05 NOTE — Telephone Encounter (Signed)
I contacted this patient to inform him that he has been scheduled to have his US Renal this Friday, June 08, 2017 @ 4pm at the [facility] but he said that he was not ready to have it at the moment. I assured him that I will cancel this appointment and that he could give [place] a call when he is ready.

## 2017-06-08 ENCOUNTER — Ambulatory Visit: Payer: Medicare PPO

## 2017-10-22 ENCOUNTER — Other Ambulatory Visit: Payer: Self-pay | Admitting: Family Medicine

## 2017-10-22 DIAGNOSIS — I252 Old myocardial infarction: Secondary | ICD-10-CM

## 2017-10-22 DIAGNOSIS — I10 Essential (primary) hypertension: Secondary | ICD-10-CM

## 2017-10-22 DIAGNOSIS — E785 Hyperlipidemia, unspecified: Secondary | ICD-10-CM

## 2017-10-22 DIAGNOSIS — I25719 Atherosclerosis of autologous vein coronary artery bypass graft(s) with unspecified angina pectoris: Secondary | ICD-10-CM

## 2017-10-24 ENCOUNTER — Other Ambulatory Visit: Payer: Self-pay | Admitting: Family Medicine

## 2017-10-24 DIAGNOSIS — I25719 Atherosclerosis of autologous vein coronary artery bypass graft(s) with unspecified angina pectoris: Secondary | ICD-10-CM

## 2017-10-24 DIAGNOSIS — E785 Hyperlipidemia, unspecified: Secondary | ICD-10-CM

## 2017-10-24 DIAGNOSIS — I252 Old myocardial infarction: Secondary | ICD-10-CM

## 2017-10-24 DIAGNOSIS — I10 Essential (primary) hypertension: Secondary | ICD-10-CM

## 2018-03-12 ENCOUNTER — Telehealth: Payer: Self-pay | Admitting: Family Medicine

## 2018-03-12 DIAGNOSIS — I25719 Atherosclerosis of autologous vein coronary artery bypass graft(s) with unspecified angina pectoris: Secondary | ICD-10-CM

## 2018-03-12 DIAGNOSIS — E785 Hyperlipidemia, unspecified: Secondary | ICD-10-CM

## 2018-03-12 DIAGNOSIS — I10 Essential (primary) hypertension: Secondary | ICD-10-CM

## 2018-03-12 DIAGNOSIS — I252 Old myocardial infarction: Secondary | ICD-10-CM

## 2018-03-14 NOTE — Telephone Encounter (Signed)
Spoke with pt and he said he will call back to schedule the appt

## 2018-03-14 NOTE — Telephone Encounter (Signed)
Patient should not need a refill until late August; please ask him to schedule a visit and we'll get labs; within the next month please; thank you

## 2018-05-21 ENCOUNTER — Telehealth: Payer: Self-pay | Admitting: Family Medicine

## 2018-05-21 DIAGNOSIS — I10 Essential (primary) hypertension: Secondary | ICD-10-CM

## 2018-05-21 DIAGNOSIS — E785 Hyperlipidemia, unspecified: Secondary | ICD-10-CM

## 2018-05-21 DIAGNOSIS — I252 Old myocardial infarction: Secondary | ICD-10-CM

## 2018-05-21 DIAGNOSIS — I25719 Atherosclerosis of autologous vein coronary artery bypass graft(s) with unspecified angina pectoris: Secondary | ICD-10-CM

## 2018-05-21 MED ORDER — LISINOPRIL 2.5 MG PO TABS: 2.5000 mg | ORAL_TABLET | Freq: Every day | ORAL | 0 refills | Status: AC

## 2018-05-21 NOTE — Telephone Encounter (Signed)
Patient has not been seen in almost one year We asked him in July to schedule an appointment He still does not have an appointment on the books I'll allow ten days of medicine and we look forward to seeing him very soon

## 2018-05-22 NOTE — Telephone Encounter (Signed)
lvm on [phone] @ 9:43 informing pt to schedule appt that a limited quantity of his medication has been sent to pharmacy

## 2018-06-03 ENCOUNTER — Telehealth: Payer: Self-pay | Admitting: Family Medicine

## 2018-06-03 DIAGNOSIS — I10 Essential (primary) hypertension: Secondary | ICD-10-CM

## 2018-06-03 DIAGNOSIS — I252 Old myocardial infarction: Secondary | ICD-10-CM

## 2018-06-03 DIAGNOSIS — E785 Hyperlipidemia, unspecified: Secondary | ICD-10-CM

## 2018-06-03 DIAGNOSIS — I25719 Atherosclerosis of autologous vein coronary artery bypass graft(s) with unspecified angina pectoris: Secondary | ICD-10-CM

## 2018-06-05 NOTE — Telephone Encounter (Signed)
See messages from July and September asking patient to schedule an appointment Limited amount given last refill with request for appointment No appointment scheduled Denying medicine and requesting appt; he is welcome to request this medicine (lisinopril) from his cardiologist

## 2018-06-05 NOTE — Telephone Encounter (Signed)
Spoke with pt and he will not be scheduling the appt. Another doctor has refilled the medication therefore he is transferring.

## 2018-06-05 NOTE — Telephone Encounter (Signed)
Thank you :)

## 2018-07-22 ENCOUNTER — Ambulatory Visit: Payer: Medicare PPO | Admitting: Cardiovascular Disease

## 2019-01-14 ENCOUNTER — Other Ambulatory Visit: Payer: Self-pay | Admitting: Family Medicine

## 2019-01-14 DIAGNOSIS — I25719 Atherosclerosis of autologous vein coronary artery bypass graft(s) with unspecified angina pectoris: Secondary | ICD-10-CM

## 2019-01-14 DIAGNOSIS — I10 Essential (primary) hypertension: Secondary | ICD-10-CM

## 2019-01-14 DIAGNOSIS — I252 Old myocardial infarction: Secondary | ICD-10-CM

## 2019-01-14 DIAGNOSIS — E785 Hyperlipidemia, unspecified: Secondary | ICD-10-CM
